# Patient Record
Sex: Female | Born: 1950 | Race: Black or African American | Hispanic: No | Marital: Married | Smoking: Never smoker
Health system: Southern US, Community
[De-identification: ages and names within clinical notes are randomized; demographics above are authoritative.]

## PROBLEM LIST (undated history)

## (undated) DIAGNOSIS — I10 Essential (primary) hypertension: Secondary | ICD-10-CM

## (undated) DIAGNOSIS — M1710 Unilateral primary osteoarthritis, unspecified knee: Secondary | ICD-10-CM

## (undated) DIAGNOSIS — M1990 Unspecified osteoarthritis, unspecified site: Secondary | ICD-10-CM

## (undated) HISTORY — PX: TONSILLECTOMY: SUR1361

## (undated) HISTORY — PX: OTHER SURGICAL HISTORY: SHX169

## (undated) HISTORY — PX: ABDOMINAL HYSTERECTOMY: SHX81

## (undated) HISTORY — PX: TOTAL KNEE ARTHROPLASTY: SHX125

## (undated) HISTORY — PX: JOINT REPLACEMENT: SHX530

---

## 2017-07-27 DIAGNOSIS — M1712 Unilateral primary osteoarthritis, left knee: Secondary | ICD-10-CM | POA: Diagnosis present

## 2017-07-27 DIAGNOSIS — I10 Essential (primary) hypertension: Secondary | ICD-10-CM | POA: Diagnosis present

## 2017-07-27 NOTE — H&P (Signed)
PREOPERATIVE H&P Patient ID: Autumn Hamilton Schreifels MRN: [id number] DOB/AGE: 12/14/1950 66 y.o.  Chief Complaint: OA LEFT  KNEE  Planned Procedure Date: 08/25/2017 Medical and cardiac clearance by Dr. Modesto CharonWong   HPI: Autumn Hamilton Denio is a 66 y.o. female who presents for evaluation of OA LEFT  KNEE. The patient has a history of pain and functional disability in both knees due to arthritis and has failed non-surgical conservative treatments for greater than 12 weeks to include NSAID's and/or analgesics, corticosteriod injections, viscosupplementation injections and activity modification.  Onset of symptoms was gradual, starting >10 years ago with gradually worsening course since that time.  Patient currently rates pain at 8 out of 10 with activity.  She has had bilateral knee arthroscopies with partial meniscectomy.  Patient has night pain, worsening of pain with activity and weight bearing and pain that interferes with activities of daily living.  Patient has evidence of subchondral cysts, subchondral sclerosis, periarticular osteophytes and joint space narrowing by imaging studies.  There is no active infection.  Past medical history: Hypertension  Medications: Duexis 800/26.6 mg, 1 up to 3 times daily as needed Biotin 10,000 mcg with keratin 100 mg.  1 daily. Aspirin 81 mg daily. Citrucel 2 capsules daily. Eplerenone 50 mg 2 tablets daily Verapamil ER 120 mg.  1 daily. Move Free joint cartilage bone supplement.  1 daily. AllerClear 10 mg antihistamine 1 daily. Tumeric curcumin 500/50 mg daily.  Surgical history: Partial hysterectomy 1995 Right knee arthroscopy, meniscectomy 2004 Left knee arthroscopy, meniscectomy 2006 Left shoulder biceps tear 2009 Tonsillectomy/adenoidectomy 1969  Allergies: NKDA  Social history:  Married never smoker no EtOH.  Retired from [employer].  Family history: Mother with HTN, cancer.  Sister with HTN, DM.  ROS: Currently denies lightheadedness, dizziness, Fever,  chills, CP, SOB.   No personal history of DVT, PE, MI, or CVA. No loose teeth or dentures All other systems have been reviewed and were otherwise currently negative with the exception of those mentioned in the HPI and as above.  Objective: Vitals: Ht: 5 feet 1 wt: 191 Temp: 97.4 BP: 138/77 pulse: 78 O2 99 % on room air. Physical Exam: General: Alert, NAD.  Antalgic Gait  HEENT: EOMI, Good Neck Extension  Pulm: No increased work of breathing.  Clear B/L A/P w/o crackle or wheeze.  CV: RRR, No m/g/r appreciated  GI: soft, NT, ND Neuro: Neuro without gross focal deficit.  Sensation intact distally Skin: No lesions in the area of chief complaint MSK/Surgical Site: Bilateral knees w/o redness or effusion.  Medial and lateral JLT. ROM 0-90.  5/5 strength in extension and flexion.  +EHL/FHL.  NVI.  Stable varus and valgus stress.    Imaging Review Plain radiographs demonstrate severe tricompartmental degenerative joint disease of the bilateral knees.   Assessment: OA LEFT  KNEE Principal Problem:   Primary osteoarthritis of left knee Active Problems:   Essential hypertension   Plan: Plan for Procedure(s): TOTAL KNEE ARTHROPLASTY  The patient history, physical exam, clinical judgement of the provider and imaging are consistent with end stage degenerative joint disease and total joint arthroplasty is deemed medically necessary. The treatment options including medical management, injection therapy, and arthroplasty were discussed at length. The risks and benefits of Procedure(s): TOTAL KNEE ARTHROPLASTY were presented and reviewed.  The risks of nonoperative treatment, versus surgical intervention including but not limited to continued pain, aseptic loosening, stiffness, dislocation/subluxation, infection, bleeding, nerve injury, blood clots, cardiopulmonary complications, morbidity, mortality, among others were discussed. The patient verbalizes understanding and wishes  to proceed with the  plan.  Patient is being admitted for inpatient treatment for surgery, pain control, PT, OT, prophylactic antibiotics, VTE prophylaxis, progressive ambulation, ADL's and discharge planning.   Dental prophylaxis discussed and recommended for 2 years postoperatively.   The patient does meet the criteria for TXA which will be used perioperatively via IV.    ASA 325 mg  will be used postoperatively for DVT prophylaxis in addition to SCDs, and early ambulation.  The patient is planning to be discharged home with home health services ([facility]) in care of her husband.  Oxycodone/Tylenol for pain.  She will continue Duexis postoperatively after discharge.  Severity of Illness: The appropriate patient status for this patient is OBSERVATION. Observation status is judged to be reasonable and necessary in order to provide the required intensity of service to ensure the patient's safety. The patient's presenting symptoms, physical exam findings, and initial radiographic and laboratory data in the context of their medical condition is felt to place them at decreased risk for further clinical deterioration. Furthermore, it is anticipated that the patient will be medically stable for discharge from the hospital within 2 midnights of admission. The following factors support the patient status of observation.    Albina BilletHenry Calvin Martensen III, PA-C 07/27/2017 5:26 PM

## 2017-08-13 ENCOUNTER — Encounter (HOSPITAL_COMMUNITY): Payer: Self-pay | Admitting: *Deleted

## 2017-08-13 NOTE — Pre-Procedure Instructions (Signed)
Autumn SellsMonica V Hamilton  08/13/2017      [id number] - [place], [place] - [address] [place] [id number] Phone: [phone] Fax: [phone]    Your procedure is scheduled on August 25, 2017  [facility] Admitting at 0530 A.M.  Call this number if you have problems the morning of surgery:  [phone]   Remember:  Do not eat food or drink liquids after midnight.  Take these medicines the morning of surgery with A SIP OF WATER Acetaminophen (Tylenol) if needed, Loratadine, and Verapamil (Calan-SR)  7 days prior to surgery STOP taking any Aspirin(unless otherwise instructed by your surgeon), Aleve, Naproxen, Ibuprofen, Motrin, Advil, Goody's, BC's, all herbal medications, fish oil, and all vitamins   Do not wear jewelry, make-up or nail polish.  Do not wear lotions, powders, or perfumes, or deodorant.  Do not shave 48 hours prior to surgery.  Do not bring valuables to the hospital.  [facility] is not responsible for any belongings or valuables.  Contacts, dentures or bridgework may not be worn into surgery.  Leave your suitcase in the car.  After surgery it may be brought to your room.  For patients admitted to the hospital, discharge time will be determined by your treatment team.  Patients discharged the day of surgery will not be allowed to drive home.    [place]- Preparing For Surgery  Before surgery, you can play an important role. Because skin is not sterile, your skin needs to be as free of germs as possible. You can reduce the number of germs on your skin by washing with CHG (chlorahexidine gluconate) Soap before surgery.  CHG is an antiseptic cleaner which kills germs and bonds with the skin to continue killing germs even after washing.  Please do not use if you have an allergy to CHG or antibacterial soaps. If your skin becomes reddened/irritated stop using the CHG.  Do not  shave (including legs and underarms) for at least 48 hours prior to first CHG shower. It is OK to shave your face.  Please follow these instructions carefully.   1. Shower the NIGHT BEFORE SURGERY and the MORNING OF SURGERY with CHG.   2. If you chose to wash your hair, wash your hair first as usual with your normal shampoo.  3. After you shampoo, rinse your hair and body thoroughly to remove the shampoo.  4. Use CHG as you would any other liquid soap. You can apply CHG directly to the skin and wash gently with a scrungie or a clean washcloth.   5. Apply the CHG Soap to your body ONLY FROM THE NECK DOWN.  Do not use on open wounds or open sores. Avoid contact with your eyes, ears, mouth and genitals (private parts). Wash Face and genitals (private parts)  with your normal soap.  6. Wash thoroughly, paying special attention to the area where your surgery will be performed.  7. Thoroughly rinse your body with warm water from the neck down.  8. DO NOT shower/wash with your normal soap after using and rinsing off the CHG Soap.  9. Pat yourself dry with a CLEAN TOWEL.  10. Wear CLEAN PAJAMAS to bed the night before surgery, wear comfortable clothes the morning of surgery  11. Place CLEAN SHEETS on your bed the night of your first shower and DO NOT SLEEP WITH PETS.    Day of Surgery: Do not apply  any deodorants/lotions. Please wear clean clothes to the hospital/surgery center.    Please read over the following fact sheets that you were given. Pain Booklet, Coughing and Deep Breathing, Total Joint Packet, MRSA Information and Surgical Site Infection Prevention

## 2017-08-14 ENCOUNTER — Encounter (HOSPITAL_COMMUNITY)
Admission: RE | Admit: 2017-08-14 | Discharge: 2017-08-14 | Disposition: A | Discharge status: Home / Self Care | Payer: 59 | Source: Ambulatory Visit | Attending: Orthopedic Surgery | Admitting: Orthopedic Surgery

## 2017-08-14 ENCOUNTER — Encounter (HOSPITAL_COMMUNITY): Payer: Self-pay

## 2017-08-14 ENCOUNTER — Other Ambulatory Visit: Payer: Self-pay

## 2017-08-14 DIAGNOSIS — M1712 Unilateral primary osteoarthritis, left knee: Secondary | ICD-10-CM | POA: Insufficient documentation

## 2017-08-14 DIAGNOSIS — Z01818 Encounter for other preprocedural examination: Secondary | ICD-10-CM | POA: Diagnosis not present

## 2017-08-14 HISTORY — DX: Unspecified osteoarthritis, unspecified site: M19.90

## 2017-08-14 LAB — BASIC METABOLIC PANEL
Anion gap: 10 (ref 5–15)
BUN: 7 mg/dL (ref 6–20)
CALCIUM: 9.4 mg/dL (ref 8.9–10.3)
CO2: 27 mmol/L (ref 22–32)
Chloride: 102 mmol/L (ref 101–111)
Creatinine, Ser: 0.77 mg/dL (ref 0.44–1.00)
GFR calc Af Amer: >60 mL/min (ref >60)
GLUCOSE: 140 mg/dL — AB (ref 65–99)
Potassium: 3.3 mmol/L — ABNORMAL LOW (ref 3.5–5.1)
Sodium: 139 mmol/L (ref 135–145)

## 2017-08-14 LAB — CBC
HCT: 37.9 % (ref 36.0–46.0)
Hemoglobin: 12.3 g/dL (ref 12.0–15.0)
MCH: 30.8 pg (ref 26.0–34.0)
MCHC: 32.5 g/dL (ref 30.0–36.0)
MCV: 94.8 fL (ref 78.0–100.0)
Platelets: 272 10*3/uL (ref 150–400)
RBC: 4 MIL/uL (ref 3.87–5.11)
RDW: 14.5 % (ref 11.5–15.5)
WBC: 5.4 10*3/uL (ref 4.0–10.5)

## 2017-08-14 LAB — SURGICAL PCR SCREEN
MRSA, PCR: NEGATIVE
STAPHYLOCOCCUS AUREUS: POSITIVE — AB

## 2017-08-14 NOTE — Progress Notes (Signed)
PCP: Dr. Leodis SiasFrancis Wong  Cardiologist: pt denies  EKG: 07/17/17-requested from Dr.   Michel SanteeStress test: pt denies ever  ECHO: pt denies ever  Cardiac Cath: pt denies ever  Chest x-ray: pt denies past year, no recent respiratory complications/infections

## 2017-08-18 ENCOUNTER — Encounter (HOSPITAL_COMMUNITY): Payer: Self-pay | Admitting: Emergency Medicine

## 2017-08-24 MED ORDER — LACTATED RINGERS IV SOLN: INTRAVENOUS | Status: DC

## 2017-08-24 MED ORDER — GABAPENTIN 300 MG PO CAPS
300.0000 mg | ORAL_CAPSULE | Freq: Once | ORAL | Status: AC
  Administered 2017-08-25: 300 mg via ORAL
  Filled 2017-08-24: qty 1

## 2017-08-24 MED ORDER — SODIUM CHLORIDE 0.9 % IV SOLN
1000.0000 mg | INTRAVENOUS | Status: AC
  Administered 2017-08-25: 1000 mg via INTRAVENOUS
  Filled 2017-08-24: qty 1100

## 2017-08-24 MED ORDER — ACETAMINOPHEN 500 MG PO TABS
1000.0000 mg | ORAL_TABLET | Freq: Once | ORAL | Status: AC
  Administered 2017-08-25: 1000 mg via ORAL
  Filled 2017-08-24: qty 2

## 2017-08-24 MED ORDER — CEFAZOLIN SODIUM-DEXTROSE 2-4 GM/100ML-% IV SOLN
2.0000 g | INTRAVENOUS | Status: AC
  Administered 2017-08-25: 2 g via INTRAVENOUS
  Filled 2017-08-24: qty 100

## 2017-08-24 NOTE — Anesthesia Preprocedure Evaluation (Addendum)
Anesthesia Evaluation  Patient identified by MRN, date of birth, ID band Patient awake    Reviewed: Allergy & Precautions, NPO status , Patient's Chart, lab work & pertinent test results  Airway Mallampati: II   Neck ROM: Full    Dental   Pulmonary neg pulmonary ROS,    breath sounds clear to auscultation       Cardiovascular hypertension,  Rhythm:Regular Rate:Normal     Neuro/Psych negative neurological ROS     GI/Hepatic negative GI ROS, Neg liver ROS,   Endo/Other  negative endocrine ROS  Renal/GU negative Renal ROS     Musculoskeletal  (+) Arthritis ,   Abdominal   Peds  Hematology   Anesthesia Other Findings   Reproductive/Obstetrics                             Anesthesia Physical Anesthesia Plan  ASA: II  Anesthesia Plan: Spinal   Post-op Pain Management:  Regional for Post-op pain   Induction: Intravenous  PONV Risk Score and Plan: 3 and Ondansetron, Dexamethasone and Treatment may vary due to age or medical condition  Airway Management Planned: Natural Airway  Additional Equipment:   Intra-op Plan:   Post-operative Plan:   Informed Consent: I have reviewed the patients History and Physical, chart, labs and discussed the procedure including the risks, benefits and alternatives for the proposed anesthesia with the patient or authorized representative who has indicated his/her understanding and acceptance.     Plan Discussed with: CRNA  Anesthesia Plan Comments:        Anesthesia Quick Evaluation

## 2017-08-25 ENCOUNTER — Inpatient Hospital Stay (HOSPITAL_COMMUNITY): Payer: 59

## 2017-08-25 ENCOUNTER — Encounter (HOSPITAL_COMMUNITY)
Admission: RE | Disposition: A | Discharge status: Home Health | Payer: Self-pay | Source: Ambulatory Visit | Attending: Orthopedic Surgery

## 2017-08-25 ENCOUNTER — Inpatient Hospital Stay (HOSPITAL_COMMUNITY): Payer: 59 | Admitting: Anesthesiology

## 2017-08-25 ENCOUNTER — Observation Stay (HOSPITAL_COMMUNITY)
Admission: RE | Admit: 2017-08-25 | Discharge: 2017-08-26 | Disposition: A | Discharge status: Home Health | Payer: 59 | Source: Ambulatory Visit | Attending: Orthopedic Surgery | Admitting: Orthopedic Surgery

## 2017-08-25 ENCOUNTER — Inpatient Hospital Stay (HOSPITAL_COMMUNITY): Payer: 59 | Admitting: Emergency Medicine

## 2017-08-25 ENCOUNTER — Encounter (HOSPITAL_COMMUNITY): Payer: Self-pay

## 2017-08-25 DIAGNOSIS — Z79899 Other long term (current) drug therapy: Secondary | ICD-10-CM | POA: Insufficient documentation

## 2017-08-25 DIAGNOSIS — Z90711 Acquired absence of uterus with remaining cervical stump: Secondary | ICD-10-CM | POA: Insufficient documentation

## 2017-08-25 DIAGNOSIS — Z833 Family history of diabetes mellitus: Secondary | ICD-10-CM | POA: Insufficient documentation

## 2017-08-25 DIAGNOSIS — Z8249 Family history of ischemic heart disease and other diseases of the circulatory system: Secondary | ICD-10-CM | POA: Diagnosis not present

## 2017-08-25 DIAGNOSIS — M170 Bilateral primary osteoarthritis of knee: Principal | ICD-10-CM | POA: Insufficient documentation

## 2017-08-25 DIAGNOSIS — Z96659 Presence of unspecified artificial knee joint: Secondary | ICD-10-CM

## 2017-08-25 DIAGNOSIS — I10 Essential (primary) hypertension: Secondary | ICD-10-CM | POA: Insufficient documentation

## 2017-08-25 DIAGNOSIS — M1712 Unilateral primary osteoarthritis, left knee: Secondary | ICD-10-CM | POA: Diagnosis present

## 2017-08-25 DIAGNOSIS — Z9889 Other specified postprocedural states: Secondary | ICD-10-CM | POA: Insufficient documentation

## 2017-08-25 HISTORY — PX: TOTAL KNEE ARTHROPLASTY: SHX125

## 2017-08-25 HISTORY — DX: Essential (primary) hypertension: I10

## 2017-08-25 SURGERY — ARTHROPLASTY, KNEE, TOTAL
Anesthesia: Spinal | Site: Knee | Laterality: Left

## 2017-08-25 MED ORDER — ACETAMINOPHEN 325 MG PO TABS: 650.0000 mg | ORAL_TABLET | ORAL | Status: DC | PRN

## 2017-08-25 MED ORDER — ASPIRIN EC 325 MG PO TBEC
325.0000 mg | DELAYED_RELEASE_TABLET | Freq: Every day | ORAL | Status: DC
  Administered 2017-08-26: 325 mg via ORAL
  Filled 2017-08-25: qty 1

## 2017-08-25 MED ORDER — MIDAZOLAM HCL 5 MG/5ML IJ SOLN
INTRAMUSCULAR | Status: DC | PRN
  Administered 2017-08-25 (×2): 1 mg via INTRAVENOUS

## 2017-08-25 MED ORDER — OXYCODONE HCL 5 MG PO TABS: 5.0000 mg | ORAL_TABLET | ORAL | 0 refills | Status: AC | PRN

## 2017-08-25 MED ORDER — SPIRONOLACTONE 25 MG PO TABS
25.0000 mg | ORAL_TABLET | Freq: Every day | ORAL | Status: DC
  Administered 2017-08-26: 25 mg via ORAL
  Filled 2017-08-25: qty 1

## 2017-08-25 MED ORDER — SENNA 8.6 MG PO TABS
1.0000 | ORAL_TABLET | Freq: Two times a day (BID) | ORAL | Status: DC
  Administered 2017-08-25 – 2017-08-26 (×2): 8.6 mg via ORAL
  Filled 2017-08-25 (×2): qty 1

## 2017-08-25 MED ORDER — ONDANSETRON HCL 4 MG PO TABS: 4.0000 mg | ORAL_TABLET | Freq: Four times a day (QID) | ORAL | Status: DC | PRN

## 2017-08-25 MED ORDER — SODIUM CHLORIDE FLUSH 0.9 % IV SOLN
INTRAVENOUS | Status: DC | PRN
  Administered 2017-08-25 (×3): 10 mL

## 2017-08-25 MED ORDER — 0.9 % SODIUM CHLORIDE (POUR BTL) OPTIME
TOPICAL | Status: DC | PRN
  Administered 2017-08-25: 1000 mL

## 2017-08-25 MED ORDER — LACTATED RINGERS IV SOLN: INTRAVENOUS | Status: DC

## 2017-08-25 MED ORDER — BUPIVACAINE HCL (PF) 0.25 % IJ SOLN
INTRAMUSCULAR | Status: DC | PRN
  Administered 2017-08-25: 30 mL

## 2017-08-25 MED ORDER — EPHEDRINE SULFATE 50 MG/ML IJ SOLN
INTRAMUSCULAR | Status: DC | PRN
  Administered 2017-08-25: 10 mg via INTRAVENOUS

## 2017-08-25 MED ORDER — METHOCARBAMOL 1000 MG/10ML IJ SOLN
500.0000 mg | Freq: Four times a day (QID) | INTRAVENOUS | Status: DC | PRN
  Filled 2017-08-25: qty 5

## 2017-08-25 MED ORDER — ONDANSETRON HCL 4 MG/2ML IJ SOLN
INTRAMUSCULAR | Status: DC | PRN
  Administered 2017-08-25: 4 mg via INTRAVENOUS

## 2017-08-25 MED ORDER — DEXAMETHASONE SODIUM PHOSPHATE 10 MG/ML IJ SOLN
10.0000 mg | Freq: Once | INTRAMUSCULAR | Status: DC
  Filled 2017-08-25: qty 1

## 2017-08-25 MED ORDER — VERAPAMIL HCL ER 120 MG PO TBCR
120.0000 mg | EXTENDED_RELEASE_TABLET | Freq: Every day | ORAL | Status: DC
  Administered 2017-08-26: 120 mg via ORAL
  Filled 2017-08-25: qty 1

## 2017-08-25 MED ORDER — ONDANSETRON HCL 4 MG/2ML IJ SOLN: 4.0000 mg | Freq: Four times a day (QID) | INTRAMUSCULAR | Status: DC | PRN

## 2017-08-25 MED ORDER — DIPHENHYDRAMINE HCL 12.5 MG/5ML PO ELIX: 12.5000 mg | ORAL_SOLUTION | ORAL | Status: DC | PRN

## 2017-08-25 MED ORDER — FENTANYL CITRATE (PF) 100 MCG/2ML IJ SOLN
INTRAMUSCULAR | Status: DC | PRN
  Administered 2017-08-25: 25 ug via INTRAVENOUS
  Administered 2017-08-25: 75 ug via INTRAVENOUS
  Administered 2017-08-25: 50 ug via INTRAVENOUS

## 2017-08-25 MED ORDER — METOCLOPRAMIDE HCL 5 MG/ML IJ SOLN: 5.0000 mg | Freq: Three times a day (TID) | INTRAMUSCULAR | Status: DC | PRN

## 2017-08-25 MED ORDER — PHENYLEPHRINE HCL 10 MG/ML IJ SOLN
INTRAMUSCULAR | Status: DC | PRN
  Administered 2017-08-25 (×2): 120 ug via INTRAVENOUS
  Administered 2017-08-25: 80 ug via INTRAVENOUS

## 2017-08-25 MED ORDER — OXYCODONE HCL 5 MG PO TABS
10.0000 mg | ORAL_TABLET | ORAL | Status: DC | PRN
  Administered 2017-08-26 (×2): 10 mg via ORAL
  Filled 2017-08-25 (×2): qty 2

## 2017-08-25 MED ORDER — CEFAZOLIN SODIUM-DEXTROSE 1-4 GM/50ML-% IV SOLN
1.0000 g | Freq: Four times a day (QID) | INTRAVENOUS | Status: AC
  Administered 2017-08-25: 1 g via INTRAVENOUS
  Filled 2017-08-25 (×3): qty 50

## 2017-08-25 MED ORDER — METHOCARBAMOL 500 MG PO TABS: 500.0000 mg | ORAL_TABLET | Freq: Four times a day (QID) | ORAL | 0 refills | Status: DC | PRN

## 2017-08-25 MED ORDER — TRANEXAMIC ACID 1000 MG/10ML IV SOLN
2000.0000 mg | INTRAVENOUS | Status: DC
  Filled 2017-08-25: qty 20

## 2017-08-25 MED ORDER — SODIUM CHLORIDE 0.9 % IJ SOLN
INTRAMUSCULAR | Status: AC
  Filled 2017-08-25: qty 30

## 2017-08-25 MED ORDER — POLYETHYLENE GLYCOL 3350 17 G PO PACK: 17.0000 g | PACK | Freq: Every day | ORAL | Status: DC | PRN

## 2017-08-25 MED ORDER — CHLORHEXIDINE GLUCONATE 4 % EX LIQD: 60.0000 mL | Freq: Once | CUTANEOUS | Status: DC

## 2017-08-25 MED ORDER — MENTHOL 3 MG MT LOZG: 1.0000 | LOZENGE | OROMUCOSAL | Status: DC | PRN

## 2017-08-25 MED ORDER — PROPOFOL 10 MG/ML IV BOLUS
INTRAVENOUS | Status: DC | PRN
  Administered 2017-08-25 (×3): 15 mg via INTRAVENOUS

## 2017-08-25 MED ORDER — CELECOXIB 200 MG PO CAPS
200.0000 mg | ORAL_CAPSULE | Freq: Two times a day (BID) | ORAL | Status: DC
  Administered 2017-08-25 – 2017-08-26 (×2): 200 mg via ORAL
  Filled 2017-08-25 (×2): qty 1

## 2017-08-25 MED ORDER — BUPIVACAINE HCL (PF) 0.25 % IJ SOLN
INTRAMUSCULAR | Status: AC
  Filled 2017-08-25: qty 30

## 2017-08-25 MED ORDER — ACETAMINOPHEN 650 MG RE SUPP: 650.0000 mg | RECTAL | Status: DC | PRN

## 2017-08-25 MED ORDER — METOCLOPRAMIDE HCL 5 MG PO TABS: 5.0000 mg | ORAL_TABLET | Freq: Three times a day (TID) | ORAL | Status: DC | PRN

## 2017-08-25 MED ORDER — PHENYLEPHRINE HCL 10 MG/ML IJ SOLN
INTRAVENOUS | Status: DC | PRN
  Administered 2017-08-25: 30 ug/min via INTRAVENOUS

## 2017-08-25 MED ORDER — PROPOFOL 500 MG/50ML IV EMUL
INTRAVENOUS | Status: DC | PRN
  Administered 2017-08-25: 80 ug/kg/min via INTRAVENOUS

## 2017-08-25 MED ORDER — OXYCODONE HCL 5 MG PO TABS
5.0000 mg | ORAL_TABLET | ORAL | Status: DC | PRN
  Administered 2017-08-26 (×2): 5 mg via ORAL
  Filled 2017-08-25: qty 1

## 2017-08-25 MED ORDER — HYDROMORPHONE HCL 1 MG/ML IJ SOLN
1.0000 mg | INTRAMUSCULAR | Status: DC | PRN
  Administered 2017-08-25: 1 mg via INTRAVENOUS
  Filled 2017-08-25: qty 1

## 2017-08-25 MED ORDER — ACETAMINOPHEN 500 MG PO TABS
1000.0000 mg | ORAL_TABLET | Freq: Three times a day (TID) | ORAL | Status: DC
  Administered 2017-08-25 – 2017-08-26 (×3): 1000 mg via ORAL
  Filled 2017-08-25 (×3): qty 2

## 2017-08-25 MED ORDER — ACETAMINOPHEN 500 MG PO TABS: 1000.0000 mg | ORAL_TABLET | Freq: Three times a day (TID) | ORAL | 0 refills | Status: AC

## 2017-08-25 MED ORDER — OMEPRAZOLE 20 MG PO CPDR: 20.0000 mg | DELAYED_RELEASE_CAPSULE | Freq: Every day | ORAL | 0 refills | Status: DC

## 2017-08-25 MED ORDER — FENTANYL CITRATE (PF) 250 MCG/5ML IJ SOLN
INTRAMUSCULAR | Status: AC
  Filled 2017-08-25: qty 5

## 2017-08-25 MED ORDER — MIDAZOLAM HCL 2 MG/2ML IJ SOLN
INTRAMUSCULAR | Status: AC
  Filled 2017-08-25: qty 2

## 2017-08-25 MED ORDER — PHENOL 1.4 % MT LIQD: 1.0000 | OROMUCOSAL | Status: DC | PRN

## 2017-08-25 MED ORDER — DOCUSATE SODIUM 100 MG PO CAPS: 100.0000 mg | ORAL_CAPSULE | Freq: Two times a day (BID) | ORAL | 0 refills | Status: DC

## 2017-08-25 MED ORDER — DOCUSATE SODIUM 100 MG PO CAPS
100.0000 mg | ORAL_CAPSULE | Freq: Two times a day (BID) | ORAL | Status: DC
  Administered 2017-08-25 – 2017-08-26 (×2): 100 mg via ORAL
  Filled 2017-08-25 (×2): qty 1

## 2017-08-25 MED ORDER — KETOROLAC TROMETHAMINE 30 MG/ML IJ SOLN
INTRAMUSCULAR | Status: DC | PRN
  Administered 2017-08-25: 30 mg via INTRA_ARTICULAR

## 2017-08-25 MED ORDER — SORBITOL 70 % SOLN: 30.0000 mL | Freq: Every day | Status: DC | PRN

## 2017-08-25 MED ORDER — METHOCARBAMOL 500 MG PO TABS: 500.0000 mg | ORAL_TABLET | Freq: Four times a day (QID) | ORAL | Status: DC | PRN

## 2017-08-25 MED ORDER — LACTATED RINGERS IV SOLN
INTRAVENOUS | Status: DC | PRN
  Administered 2017-08-25 (×2): via INTRAVENOUS

## 2017-08-25 MED ORDER — ONDANSETRON HCL 4 MG PO TABS: 4.0000 mg | ORAL_TABLET | Freq: Three times a day (TID) | ORAL | 0 refills | Status: DC | PRN

## 2017-08-25 MED ORDER — DEXAMETHASONE SODIUM PHOSPHATE 10 MG/ML IJ SOLN
INTRAMUSCULAR | Status: DC | PRN
  Administered 2017-08-25: 5 mg via INTRAVENOUS

## 2017-08-25 MED ORDER — PROPOFOL 10 MG/ML IV BOLUS
INTRAVENOUS | Status: AC
  Filled 2017-08-25: qty 20

## 2017-08-25 MED ORDER — ROPIVACAINE HCL 7.5 MG/ML IJ SOLN
INTRAMUSCULAR | Status: DC | PRN
  Administered 2017-08-25: 20 mL via PERINEURAL

## 2017-08-25 MED ORDER — BUPIVACAINE IN DEXTROSE 0.75-8.25 % IT SOLN
INTRATHECAL | Status: DC | PRN
  Administered 2017-08-25: 1.6 mL via INTRATHECAL

## 2017-08-25 MED ORDER — SODIUM CHLORIDE 0.9 % IR SOLN
Status: DC | PRN
  Administered 2017-08-25: 3000 mL

## 2017-08-25 MED ORDER — ASPIRIN EC 325 MG PO TBEC: 325.0000 mg | DELAYED_RELEASE_TABLET | Freq: Every day | ORAL | 0 refills | Status: DC

## 2017-08-25 SURGICAL SUPPLY — 54 items
BANDAGE ELASTIC 6 VELCRO ST LF (GAUZE/BANDAGES/DRESSINGS) ×2 IMPLANT
BANDAGE ESMARK 6X9 LF (GAUZE/BANDAGES/DRESSINGS) ×1 IMPLANT
BLADE SAG 18X100X1.27 (BLADE) ×4 IMPLANT
BNDG ELASTIC 6X10 VLCR STRL LF (GAUZE/BANDAGES/DRESSINGS) ×2 IMPLANT
BNDG ELASTIC 6X15 VLCR STRL LF (GAUZE/BANDAGES/DRESSINGS) ×2 IMPLANT
BNDG ESMARK 6X9 LF (GAUZE/BANDAGES/DRESSINGS) ×2
BOWL SMART MIX CTS (DISPOSABLE) ×2 IMPLANT
CAPT KNEE TRIATH TK-4 ×2 IMPLANT
CLSR STERI-STRIP ANTIMIC 1/2X4 (GAUZE/BANDAGES/DRESSINGS) ×4 IMPLANT
COVER SURGICAL LIGHT HANDLE (MISCELLANEOUS) ×2 IMPLANT
CUFF TOURNIQUET SINGLE 34IN LL (TOURNIQUET CUFF) ×2 IMPLANT
DRAPE EXTREMITY T 121X128X90 (DRAPE) ×2 IMPLANT
DRAPE HALF SHEET 40X57 (DRAPES) ×2 IMPLANT
DRAPE U-SHAPE 47X51 STRL (DRAPES) ×2 IMPLANT
DRSG MEPILEX BORDER 4X12 (GAUZE/BANDAGES/DRESSINGS) ×2 IMPLANT
DRSG MEPILEX BORDER 4X8 (GAUZE/BANDAGES/DRESSINGS) ×2 IMPLANT
DURAPREP 26ML APPLICATOR (WOUND CARE) ×4 IMPLANT
ELECT CAUTERY BLADE 6.4 (BLADE) ×2 IMPLANT
ELECT REM PT RETURN 9FT ADLT (ELECTROSURGICAL) ×2
ELECTRODE REM PT RTRN 9FT ADLT (ELECTROSURGICAL) ×1 IMPLANT
FACESHIELD WRAPAROUND (MASK) ×4 IMPLANT
GLOVE BIO SURGEON STRL SZ7.5 (GLOVE) ×4 IMPLANT
GLOVE BIOGEL PI IND STRL 8 (GLOVE) ×2 IMPLANT
GLOVE BIOGEL PI INDICATOR 8 (GLOVE) ×2
GOWN STRL REUS W/ TWL LRG LVL3 (GOWN DISPOSABLE) ×2 IMPLANT
GOWN STRL REUS W/ TWL XL LVL3 (GOWN DISPOSABLE) ×2 IMPLANT
GOWN STRL REUS W/TWL LRG LVL3 (GOWN DISPOSABLE) ×2
GOWN STRL REUS W/TWL XL LVL3 (GOWN DISPOSABLE) ×2
HANDPIECE INTERPULSE COAX TIP (DISPOSABLE) ×1
IMMOBILIZER KNEE 22 UNIV (SOFTGOODS) ×2 IMPLANT
KIT BASIN OR (CUSTOM PROCEDURE TRAY) ×2 IMPLANT
KIT ROOM TURNOVER OR (KITS) ×2 IMPLANT
MANIFOLD NEPTUNE II (INSTRUMENTS) ×2 IMPLANT
NEEDLE 22X1 1/2 (OR ONLY) (NEEDLE) ×2 IMPLANT
NS IRRIG 1000ML POUR BTL (IV SOLUTION) ×2 IMPLANT
PACK TOTAL JOINT (CUSTOM PROCEDURE TRAY) ×2 IMPLANT
PAD ARMBOARD 7.5X6 YLW CONV (MISCELLANEOUS) ×2 IMPLANT
SET HNDPC FAN SPRY TIP SCT (DISPOSABLE) ×1 IMPLANT
SUCTION FRAZIER HANDLE 10FR (MISCELLANEOUS) ×1
SUCTION TUBE FRAZIER 10FR DISP (MISCELLANEOUS) ×1 IMPLANT
SUT MNCRL AB 4-0 PS2 18 (SUTURE) ×2 IMPLANT
SUT MON AB 2-0 CT1 27 (SUTURE) ×2 IMPLANT
SUT MON AB 2-0 CT1 36 (SUTURE) ×4 IMPLANT
SUT VIC AB 0 CT1 27 (SUTURE) ×2
SUT VIC AB 0 CT1 27XBRD ANBCTR (SUTURE) ×2 IMPLANT
SUT VIC AB 1 CT1 27 (SUTURE) ×3
SUT VIC AB 1 CT1 27XBRD ANBCTR (SUTURE) ×3 IMPLANT
SUT VIC AB 2-0 CT1 27 (SUTURE) ×1
SUT VIC AB 2-0 CT1 TAPERPNT 27 (SUTURE) ×1 IMPLANT
SYR 50ML LL SCALE MARK (SYRINGE) ×2 IMPLANT
TOWEL OR 17X24 6PK STRL BLUE (TOWEL DISPOSABLE) ×2 IMPLANT
TOWEL OR 17X26 10 PK STRL BLUE (TOWEL DISPOSABLE) ×2 IMPLANT
TRAY CATH 16FR W/PLASTIC CATH (SET/KITS/TRAYS/PACK) IMPLANT
TRAY FOLEY BAG SILVER LF 14FR (CATHETERS) IMPLANT

## 2017-08-25 NOTE — Progress Notes (Signed)
Orthopedic Tech Progress Note Patient Details:  Autumn Hamilton 03/12/1951 [id number]  CPM Left Knee CPM Left Knee: On Left Knee Flexion (Degrees): 90 Left Knee Extension (Degrees): 0 Additional Comments: trapeze bar patient helper  Post Interventions Patient Tolerated: Well Instructions Provided: Care of device  Nikki DomCrawford, Taquita Demby 08/25/2017, 10:59 AM Viewed order from doctor's order list

## 2017-08-25 NOTE — Op Note (Signed)
DATE OF SURGERY:  08/25/2017 TIME: 8:54 AM  PATIENT NAME:  Autumn Hamilton   AGE: 67 y.o.    PRE-OPERATIVE DIAGNOSIS:  OA LEFT  KNEE  POST-OPERATIVE DIAGNOSIS:  Same  PROCEDURE:  Procedure(s): TOTAL KNEE ARTHROPLASTY   SURGEON:  MURPHY, TIMOTHY D, MD   ASSISTANT:  Aquilla HackerHenry Martensen, PA-C, he was present and scrubbed throughout the case, critical for completion in a timely fashion, and for retraction, instrumentation, and closure.    OPERATIVE IMPLANTS: Stryker Triathlon Posterior Stabilized. Press fit knee  Femur size 3, Tibia size 4, Patella size 29 3-peg oval button, with a 9 mm polyethylene insert.   PREOPERATIVE INDICATIONS:  Autumn SellsMonica V Hamilton is a 67 y.o. year old female with end stage bone on bone degenerative arthritis of the knee who failed conservative treatment, including injections, antiinflammatories, activity modification, and assistive devices, and had significant impairment of their activities of daily living, and elected for Total Knee Arthroplasty.   The risks, benefits, and alternatives were discussed at length including but not limited to the risks of infection, bleeding, nerve injury, stiffness, blood clots, the need for revision surgery, cardiopulmonary complications, among others, and they were willing to proceed.   OPERATIVE DESCRIPTION:  The patient was brought to the operative room and placed in a supine position.  General anesthesia was administered.  IV antibiotics were given.  The lower extremity was prepped and draped in the usual sterile fashion.  Time out was performed.  The leg was elevated and exsanguinated and the tourniquet was inflated.  Anterior approach was performed.  The patella was everted and osteophytes were removed.  The anterior horn of the medial and lateral meniscus was removed.   The distal femur was opened with the drill and the intramedullary distal femoral cutting jig was utilized, set at 5 degrees resecting 8 mm off the distal femur.   Care was taken to protect the collateral ligaments.  The distal femoral sizing jig was applied, taking care to avoid notching.  Then the 4-in-1 cutting jig was applied and the anterior and posterior femur was cut, along with the chamfer cuts.  All posterior osteophytes were removed.  The flexion gap was then measured and was symmetric with the extension gap.  Then the extramedullary tibial cutting jig was utilized making the appropriate cut using the anterior tibial crest as a reference building in appropriate posterior slope.  Care was taken during the cut to protect the medial and collateral ligaments.  The proximal tibia was removed along with the posterior horns of the menisci.  The PCL was sacrificed.    The extensor gap was measured and was approximately 9mm.    I completed the distal femoral preparation using the appropriate jig to prepare the box.  The patella was then measured, and cut with the saw.    The proximal tibia sized and prepared accordingly with the reamer and the punch, and then all components were trialed with the above sized poly insert.  The knee was found to have excellent balance and full motion.    The above named components were then impacted into place and Poly tibial piece and patella were inserted.  I was very happy with his stability and ROM  I performed a periarticular injection with marcaine and toradol  The knee was easily taken through a range of motion and the patella tracked well and the knee irrigated copiously and the parapatellar and subcutaneous tissue closed with vicryl, and monocryl with steri strips for the skin.  The incision was dressed with sterile gauze and the tourniquet released and the patient was awakened and returned to the PACU in stable and satisfactory condition.  There were no complications.  Total tourniquet time was roughly 70 minutes.   POSTOPERATIVE PLAN: post op Abx, DVT px: SCD's, TED's, Early ambulation and chemical  px

## 2017-08-25 NOTE — Anesthesia Procedure Notes (Signed)
Procedure Name: MAC Date/Time: 08/25/2017 7:45 AM Performed by: White, Amedeo Plenty, CRNA Pre-anesthesia Checklist: Patient identified, Emergency Drugs available, Suction available and Patient being monitored Patient Re-evaluated:Patient Re-evaluated prior to induction Oxygen Delivery Method: Simple face mask

## 2017-08-25 NOTE — Anesthesia Procedure Notes (Addendum)
Anesthesia Regional Block: Adductor canal block   Pre-Anesthetic Checklist: ,, timeout performed, Correct Patient, Correct Site, Correct Laterality, Correct Procedure, Correct Position, site marked, Risks and benefits discussed,  Surgical consent,  Pre-op evaluation,  At surgeon's request and post-op pain management  Laterality: Left and Lower  Prep: chloraprep       Needles:   Needle Type: Echogenic Stimulator Needle     Needle Length: 9cm  Needle Gauge: 21   Needle insertion depth: 7 cm   Additional Needles:   Procedures:,,,, ultrasound used (permanent image in chart),,,,  Narrative:  Start time: 08/25/2017 6:55 AM End time: 08/25/2017 7:11 AM Injection made incrementally with aspirations every 5 mL.  Performed by: Personally  Anesthesiologist: Sharee HolsterMassagee, Stepehn Eckard, MD

## 2017-08-25 NOTE — Evaluation (Signed)
Physical Therapy Evaluation Patient Details Name: Autumn Hamilton MRN: [id number] DOB: 05/05/1951 Today's Date: 08/25/2017   History of Present Illness  67 y.o. year old female with end stage bone on bone degenerative arthritis of the L knee, s/p L TKA 08/25/17 . PMH of HTN  Clinical Impression  Pt is s/p TKA resulting in the deficits listed below (see PT Problem List). 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 Pt currently mod I for bed mobility, min guard for transfers and ambulation of 20 feet with RW. Pt will benefit from skilled PT to increase their independence and safety with mobility to allow discharge to the venue listed below.      Follow Up Recommendations Home health PT;Supervision/Assistance - 24 hour    Equipment Recommendations  None recommended by PT    Recommendations for Other Services       Precautions / Restrictions Precautions Precautions: Knee Precaution Booklet Issued: Yes (comment) Precaution Comments: TKA exercise booklet Restrictions Weight Bearing Restrictions: Yes LLE Weight Bearing: Weight bearing as tolerated      Mobility  Bed Mobility Overal bed mobility: Modified Independent             General bed mobility comments: HoB elevated, use of bedrails to pull to EoB  Transfers Overall transfer level: Needs assistance Equipment used: Rolling walker (2 wheeled) Transfers: Sit to/from Stand Sit to Stand: Min guard         General transfer comment: min guard for safety, from bed surface and from toilet, good power up and steadying with  RW  Ambulation/Gait Ambulation/Gait assistance: Min guard Ambulation Distance (Feet): 20 Feet Assistive device: Rolling walker (2 wheeled) Gait Pattern/deviations: Step-through pattern;Decreased stance time - left;Decreased weight shift to left;Decreased step length - right Gait velocity: slowed Gait velocity interpretation: Below normal speed for age/gender General Gait Details:  hands on min guard for safety, no LoB, no knee buckling        Balance Overall balance assessment: Needs assistance Sitting-balance support: No upper extremity supported;Feet supported Sitting balance-Leahy Scale: Normal     Standing balance support: No upper extremity supported;During functional activity Standing balance-Leahy Scale: Good                               Pertinent Vitals/Pain Pain Assessment: No/denies pain    Home Living Family/patient expects to be discharged to:: Private residence Living Arrangements: Spouse/significant other Available Help at Discharge: Family;Available 24 hours/day Type of Home: House Home Access: Stairs to enter Entrance Stairs-Rails: Right Entrance Stairs-Number of Steps: 6 Home Layout: Multi-level;Able to live on main level with bedroom/bathroom Home Equipment: Dan Humphreys - 2 wheels;Shower seat - built in      Prior Function Level of Independence: Independent         Comments: Engineer, structural, driver        Extremity/Trunk Assessment   Upper Extremity Assessment Upper Extremity Assessment: Overall WFL for tasks assessed    Lower Extremity Assessment Lower Extremity Assessment: RLE deficits/detail;LLE deficits/detail LLE Deficits / Details: L knee ROM limited LLE: Unable to fully assess due to pain       Communication   Communication: No difficulties  Cognition Arousal/Alertness: Awake/alert Behavior During Therapy: WFL for tasks assessed/performed Overall Cognitive Status: Within Functional Limits for tasks assessed  General Comments General comments (skin integrity, edema, etc.): VSS, pt husband and sisters present,         Assessment/Plan    PT Assessment Patient needs continued PT services  PT Problem List Decreased mobility;Decreased activity tolerance;Decreased range of motion;Decreased strength       PT Treatment Interventions DME  instruction;Gait training;Stair training;Functional mobility training;Therapeutic activities;Therapeutic exercise;Balance training;Patient/family education    PT Goals (Current goals can be found in the Care Plan section)  Acute Rehab PT Goals Patient Stated Goal: get back to dancing PT Goal Formulation: With patient Time For Goal Achievement: 09/08/17 Potential to Achieve Goals: Good    Frequency 7X/week    AM-PAC PT "6 Clicks" Daily Activity  Outcome Measure Difficulty turning over in bed (including adjusting bedclothes, sheets and blankets)?: A Little Difficulty moving from lying on back to sitting on the side of the bed? : A Little Difficulty sitting down on and standing up from a chair with arms (e.g., wheelchair, bedside commode, etc,.)?: A Little Help needed moving to and from a bed to chair (including a wheelchair)?: None Help needed walking in hospital room?: None Help needed climbing 3-5 steps with a railing? : A Lot 6 Click Score: 19    End of Session Equipment Utilized During Treatment: Gait belt Activity Tolerance: Patient tolerated treatment well Patient left: in chair;with call bell/phone within reach;with family/visitor present Nurse Communication: Mobility status PT Visit Diagnosis: Other abnormalities of gait and mobility (R26.89);Difficulty in walking, not elsewhere classified (R26.2)    Time: 1610-1650 PT Time Calculation (min) (ACUTE ONLY): 40 min   Charges:   PT Evaluation $PT Eval Low Complexity: 1 Low PT Treatments $Gait Training: 8-22 mins $Therapeutic Activity: 8-22 mins   PT G Codes:        [name] PT, DPT Acute Rehabilitation  [phone] Pager [phone]    [name] 08/25/2017, 5:13 PM

## 2017-08-25 NOTE — Interval H&P Note (Signed)
History and Physical Interval Note:  08/25/2017 7:32 AM  Autumn Hamilton  has presented today for surgery, with the diagnosis of OA LEFT  KNEE  The various methods of treatment have been discussed with the patient and family. After consideration of risks, benefits and other options for treatment, the patient has consented to  Procedure(s): TOTAL KNEE ARTHROPLASTY (Left) as a surgical intervention .  The patient's history has been reviewed, patient examined, no change in status, stable for surgery.  I have reviewed the patient's chart and labs.  Questions were answered to the patient's satisfaction.     MURPHY, TIMOTHY D

## 2017-08-25 NOTE — Anesthesia Procedure Notes (Signed)
Spinal  Patient location during procedure: OR Start time: 08/25/2017 7:35 AM End time: 08/25/2017 7:45 AM Staffing Anesthesiologist: Sharee HolsterMassagee, Octaviano Mukai, MD Performed: anesthesiologist  Preanesthetic Checklist Completed: patient identified, site marked, surgical consent, pre-op evaluation, timeout performed, IV checked, risks and benefits discussed and monitors and equipment checked Spinal Block Patient position: sitting Prep: ChloraPrep and site prepped and draped Patient monitoring: heart rate, cardiac monitor, continuous pulse ox and blood pressure Approach: midline Location: L3-4 Injection technique: single-shot Needle Needle type: Pencan  Needle gauge: 24 G Needle length: 9 cm Needle insertion depth: 5 cm Assessment Sensory level: T6

## 2017-08-25 NOTE — Plan of Care (Signed)
  Progressing Education: Knowledge of General Education information will improve 08/25/2017 1619 - Progressing by Darreld Mcleanox, Sacheen Arrasmith, RN Health Behavior/Discharge Planning: Ability to manage health-related needs will improve 08/25/2017 1619 - Progressing by Darreld Mcleanox, Strummer Canipe, RN Clinical Measurements: Ability to maintain clinical measurements within normal limits will improve 08/25/2017 1619 - Progressing by Darreld Mcleanox, Rumi Kolodziej, RN Will remain free from infection 08/25/2017 1619 - Progressing by Darreld Mcleanox, Hanad Leino, RN Diagnostic test results will improve 08/25/2017 1619 - Progressing by Darreld Mcleanox, Jibreel Fedewa, RN Respiratory complications will improve 08/25/2017 1619 - Progressing by Darreld Mcleanox, Jaquis Picklesimer, RN Cardiovascular complication will be avoided 08/25/2017 1619 - Progressing by Darreld Mcleanox, Davona Kinoshita, RN Activity: Risk for activity intolerance will decrease 08/25/2017 1619 - Progressing by Darreld Mcleanox, Taitum Alms, RN Nutrition: Adequate nutrition will be maintained 08/25/2017 1619 - Progressing by Darreld Mcleanox, Maddie Brazier, RN Coping: Level of anxiety will decrease 08/25/2017 1619 - Progressing by Darreld Mcleanox, Ivery Nanney, RN Elimination: Will not experience complications related to bowel motility 08/25/2017 1619 - Progressing by Darreld Mcleanox, Julian Medina, RN Will not experience complications related to urinary retention 08/25/2017 1619 - Progressing by Darreld Mcleanox, Beauty Pless, RN Pain Managment: General experience of comfort will improve 08/25/2017 1619 - Progressing by Darreld Mcleanox, Eddy Liszewski, RN Safety: Ability to remain free from injury will improve 08/25/2017 1619 - Progressing by Darreld Mcleanox, Sitlali Koerner, RN Skin Integrity: Risk for impaired skin integrity will decrease 08/25/2017 1619 - Progressing by Darreld Mcleanox, Kahealani Yankovich, RN

## 2017-08-25 NOTE — Transfer of Care (Signed)
Immediate Anesthesia Transfer of Care Note  Patient: Autumn Hamilton  Procedure(s) Performed: TOTAL KNEE ARTHROPLASTY (Left Knee)  Patient Location: PACU  Anesthesia Type:MAC combined with regional for post-op pain  Level of Consciousness: awake, alert , oriented and patient cooperative  Airway & Oxygen Therapy: Patient Spontanous Breathing  Post-op Assessment: Report given to RN and Post -op Vital signs reviewed and stable  Post vital signs: Reviewed and stable  Last Vitals:  Vitals:   08/25/17 0548 08/25/17 0559  BP: (!) 170/85 (!) 159/77  Pulse: 85   Resp: 20   Temp: 36.6 C   SpO2: 100%     Last Pain:  Vitals:   08/25/17 0548  PainSc: 7          Complications: No apparent anesthesia complications

## 2017-08-25 NOTE — Discharge Instructions (Signed)

## 2017-08-25 NOTE — Anesthesia Postprocedure Evaluation (Signed)
Anesthesia Post Note  Patient: Gasper SellsMonica V Goatley  Procedure(s) Performed: TOTAL KNEE ARTHROPLASTY (Left Knee)     Patient location during evaluation: PACU Anesthesia Type: Spinal Level of consciousness: oriented and awake and alert Pain management: pain level controlled Vital Signs Assessment: post-procedure vital signs reviewed and stable Respiratory status: spontaneous breathing, respiratory function stable and patient connected to nasal cannula oxygen Cardiovascular status: blood pressure returned to baseline and stable Postop Assessment: no headache, no backache and no apparent nausea or vomiting Anesthetic complications: no    Last Vitals:  Vitals:   08/25/17 1400 08/25/17 1415  BP: 124/62 131/76  Pulse: 84 83  Resp: 15 18  Temp:  36.4 C  SpO2: 100% 99%    Last Pain:  Vitals:   08/25/17 1415  PainSc: 0-No pain                 Tiarah Shisler,JAMES TERRILL

## 2017-08-26 ENCOUNTER — Encounter (HOSPITAL_COMMUNITY): Payer: Self-pay | Admitting: Orthopedic Surgery

## 2017-08-26 DIAGNOSIS — M170 Bilateral primary osteoarthritis of knee: Secondary | ICD-10-CM | POA: Diagnosis not present

## 2017-08-26 NOTE — Discharge Summary (Signed)
Discharge Summary  Patient ID: Autumn Hamilton MRN: [id number] DOB/AGE: 65-06-1951 65 y.o.  Admit date: 08/25/2017 Discharge date: 08/26/2017  Admission Diagnoses:  Primary osteoarthritis of left knee  Discharge Diagnoses:  Principal Problem:   Primary osteoarthritis of left knee Active Problems:   Essential hypertension   Past Medical History:  Diagnosis Date  . Arthritis   . Hypertension     Surgeries: Procedure(s): TOTAL KNEE ARTHROPLASTY on 08/25/2017   Consultants (if any):   Discharged Condition: Improved  Hospital Course: Autumn Hamilton is an 67 y.o. female who was admitted 08/25/2017 with a diagnosis of Primary osteoarthritis of left knee and went to the operating room on 08/25/2017 and underwent the above named procedures.    She was given perioperative antibiotics:  Anti-infectives (From admission, onward)   Start     Dose/Rate Route Frequency Ordered Stop   08/25/17 1600  ceFAZolin (ANCEF) IVPB 1 g/50 mL premix     1 g 100 mL/hr over 30 Minutes Intravenous Every 6 hours 08/25/17 1510 08/26/17 0359   08/25/17 0700  ceFAZolin (ANCEF) IVPB 2g/100 mL premix     2 g 200 mL/hr over 30 Minutes Intravenous To [facility] 08/24/17 1208 08/25/17 0739    .  She was given sequential compression devices, early ambulation, and Aspirin for DVT prophylaxis.  She benefited maximally from the hospital stay and there were no complications.    Recent vital signs:  Vitals:   08/26/17 0354 08/26/17 1333  BP: 111/65 111/60  Pulse: 94 98  Resp: 16   Temp: 98 F (36.7 C) 98.3 F (36.8 C)  SpO2: 100% 97%    Recent laboratory studies:  Lab Results  Component Value Date   HGB 12.3 08/14/2017   Lab Results  Component Value Date   WBC 5.4 08/14/2017   PLT 272 08/14/2017   No results found for: INR Lab Results  Component Value Date   NA 139 08/14/2017   K 3.3 (L) 08/14/2017   CL 102 08/14/2017   CO2 27 08/14/2017   BUN 7 08/14/2017   CREATININE 0.77  08/14/2017   GLUCOSE 140 (H) 08/14/2017    Discharge Medications:   Allergies as of 08/26/2017   No Known Allergies     Medication List    STOP taking these medications   acetaminophen 650 MG CR tablet Commonly known as:  TYLENOL Replaced by:  acetaminophen 500 MG tablet     TAKE these medications   acetaminophen 500 MG tablet Commonly known as:  TYLENOL Take 2 tablets (1,000 mg total) by mouth every 8 (eight) hours for 14 days. For Pain. Replaces:  acetaminophen 650 MG CR tablet   aspirin EC 325 MG tablet Take 1 tablet (325 mg total) by mouth daily. For 30 days post op for DVT Prophylaxis What changed:    medication strength  how much to take  additional instructions   Biotin Plus Keratin 10000-100 MCG-MG Tabs Take 1 capsule by mouth daily.   CITRUCEL 500 MG Tabs Generic drug:  Methylcellulose (Laxative) Take 1,000 mg by mouth daily.   docusate sodium 100 MG capsule Commonly known as:  COLACE Take 1 capsule (100 mg total) by mouth 2 (two) times daily. To prevent constipation while taking pain medication.   DUEXIS 800-26.6 MG Tabs Generic drug:  Ibuprofen-Famotidine Take 1 tablet by mouth 3 (three) times daily as needed (for pain.).   eplerenone 50 MG tablet Commonly known as:  INSPRA Take 100 mg by mouth daily.  KLS ALLERCLEAR 10 MG tablet Generic drug:  loratadine Take 10 mg by mouth daily.   methocarbamol 500 MG tablet Commonly known as:  ROBAXIN Take 1 tablet (500 mg total) by mouth every 6 (six) hours as needed for muscle spasms.   MOVE FREE PO Take 1 tablet by mouth daily.   omeprazole 20 MG capsule Commonly known as:  PRILOSEC Take 1 capsule (20 mg total) by mouth daily. 30 days for gastroprotection while taking NSAIDs.   ondansetron 4 MG tablet Commonly known as:  ZOFRAN Take 1 tablet (4 mg total) by mouth every 8 (eight) hours as needed for nausea or vomiting.   oxyCODONE 5 MG immediate release tablet Commonly known as:   ROXICODONE Take 1-2 tablets (5-10 mg total) by mouth every 4 (four) hours as needed for breakthrough pain.   TURMERIC CURCUMIN PO Take 500 mg by mouth daily.   verapamil 120 MG CR tablet Commonly known as:  CALAN-SR Take 120 mg by mouth daily.       Diagnostic Studies: Dg Knee Left Port  Result Date: 08/25/2017 CLINICAL DATA:  Status post left knee arthroplasty. EXAM: PORTABLE LEFT KNEE - 1-2 VIEW COMPARISON:  None. FINDINGS: The femoral, tibial and patellar components appear to be well situated. Expected postoperative changes are noted in the anterior soft tissues. IMPRESSION: Status post left total knee arthroplasty. Electronically Signed   By: Lupita RaiderJames  Green Jr, M.D.   On: 08/25/2017 10:40    Disposition: Home w/ HHPT  Discharge Instructions    Discharge patient   Complete by:  As directed    In the P.M. After therapy sessions.   Discharge disposition:  01-Home or Self Care   Discharge patient date:  08/26/2017      Follow-up Information    Sheral ApleyMurphy, Timothy D, MD Follow up.   Specialty:  Orthopedic Surgery Contact information: [address] [place] [address] [phone]        [facility] Follow up.   Specialty:  Home Health Services Why:  A representative from [facility] will contact you to arrange start date and time for your therapy. Contact information: [address] [place] [address] [place] [address] [phone]            Signed: Albina BilletHenry Calvin Martensen III PA-C 08/26/2017, 5:53 PM

## 2017-08-26 NOTE — Progress Notes (Signed)
Physical Therapy Treatment Patient Details Name: Autumn Hamilton MRN: [id number] DOB: 12-Jan-1951 Today's Date: 08/26/2017    History of Present Illness 67 y.o. year old female with end stage bone on bone degenerative arthritis of the L knee, s/p L TKA 08/25/17 . PMH of HTN    PT Comments    Patient is progressing well toward mobility goals. Pt tolerated gait and stair training this session. HEP next session. Current plan remains appropriate.    Follow Up Recommendations  Home health PT;Supervision/Assistance - 24 hour     Equipment Recommendations  None recommended by PT    Recommendations for Other Services       Precautions / Restrictions Precautions Precautions: Knee Precaution Booklet Issued: Yes (comment) Precaution Comments: reviewed precautions/positioning with pt Restrictions Weight Bearing Restrictions: Yes LLE Weight Bearing: Weight bearing as tolerated    Mobility  Bed Mobility               General bed mobility comments: pt sitting EOB upon arrival  Transfers Overall transfer level: Needs assistance Equipment used: Rolling walker (2 wheeled) Transfers: Sit to/from Stand Sit to Stand: Min guard         General transfer comment: min guard for safety  Ambulation/Gait Ambulation/Gait assistance: Min guard Ambulation Distance (Feet): 150 Feet Assistive device: Rolling walker (2 wheeled) Gait Pattern/deviations: Step-through pattern;Decreased stance time - left;Decreased step length - right Gait velocity: decreased   General Gait Details: cues for sequencing and proximity to [employer] Stairs: Yes   Stair Management: One rail Right;Step to pattern;Sideways;No rails;Backwards;With walker Number of Stairs: (4 steps with R rail and 2 steps backwards with RW) General stair comments: cues for sequencing and technique; assist to stabilize RW  Wheelchair Mobility    Modified Rankin (Stroke Patients Only)       Balance Overall balance  assessment: Needs assistance Sitting-balance support: No upper extremity supported;Feet supported Sitting balance-Leahy Scale: Normal     Standing balance support: No upper extremity supported;During functional activity Standing balance-Leahy Scale: Fair                              Cognition Arousal/Alertness: Awake/alert Behavior During Therapy: WFL for tasks assessed/performed Overall Cognitive Status: Within Functional Limits for tasks assessed                                        Exercises      General Comments        Pertinent Vitals/Pain Pain Assessment: Faces Faces Pain Scale: Hurts a little bit Pain Location: L knee Pain Descriptors / Indicators: Aching Pain Intervention(s): Monitored during session;Repositioned;Patient requesting pain meds-RN notified;Limited activity within patient's tolerance    Home Living                      Prior Function            PT Goals (current goals can now be found in the care plan section) Acute Rehab PT Goals Patient Stated Goal: get back to dancing PT Goal Formulation: With patient Time For Goal Achievement: 09/08/17 Potential to Achieve Goals: Good Progress towards PT goals: Progressing toward goals    Frequency    7X/week      PT Plan Current plan remains appropriate    Co-evaluation  AM-PAC PT "6 Clicks" Daily Activity  Outcome Measure  Difficulty turning over in bed (including adjusting bedclothes, sheets and blankets)?: A Little Difficulty moving from lying on back to sitting on the side of the bed? : A Little Difficulty sitting down on and standing up from a chair with arms (e.g., wheelchair, bedside commode, etc,.)?: Unable Help needed moving to and from a bed to chair (including a wheelchair)?: None Help needed walking in hospital room?: A Little Help needed climbing 3-5 steps with a railing? : A Little 6 Click Score: 17    End of Session  Equipment Utilized During Treatment: Gait belt Activity Tolerance: Patient tolerated treatment well Patient left: in chair;with call bell/phone within reach Nurse Communication: Mobility status PT Visit Diagnosis: Other abnormalities of gait and mobility (R26.89);Difficulty in walking, not elsewhere classified (R26.2)     Time: [id number] PT Time Calculation (min) (ACUTE ONLY): 25 min  Charges:  $Gait Training: 23-37 mins                    G Codes:       Erline LevineKellyn Rylie Knierim, PTA Pager: [phone]     Carolynne EdouardKellyn R Maridee Slape 08/26/2017, 10:03 AM

## 2017-08-26 NOTE — Progress Notes (Signed)
   Assessment / Plan: 1 Day Post-Op  S/P Procedure(s) (LRB): TOTAL KNEE ARTHROPLASTY (Left) by Dr. Jewel Baizeimothy D. Murphy on 08/25/17  Principal Problem:   Primary osteoarthritis of left knee Active Problems:   Essential hypertension   Primary OA Left Knee Doing well POD1 Eating, drinking, voiding, and mobilizing.   Pain Controlled.  Up with therapy Incentive Spirometry Elevate and apply ice  Weight Bearing: Weight Bearing as Tolerated (WBAT) LLE  Dressings: Maintain Mepilex.  VTE prophylaxis: Aspirin, SCDs, ambulation Dispo: Home today after therapy sessions  Subjective: Patient reports pain as mild to moderate, controlled.  Tolerating diet.  Urinating.  No CP, SOB.  OOB.  Objective:   VITALS:   Vitals:   08/25/17 1655 08/25/17 2021 08/26/17 0036 08/26/17 0354  BP: (!) 148/69 (!) 119/55 126/77 111/65  Pulse: (!) 111 (!) 103 (!) 111 94  Resp:  16 16 16   Temp: 97.9 F (36.6 C) 97.7 F (36.5 C) 98.1 F (36.7 C) 98 F (36.7 C)  TempSrc: Oral Oral Oral Oral  SpO2: 94% 92% 100% 100%  Weight:      Height:       CBC Latest Ref Rng & Units 08/14/2017  WBC 4.0 - 10.5 K/uL 5.4  Hemoglobin 12.0 - 15.0 g/dL 96.012.3  Hematocrit 45.436.0 - 46.0 % 37.9  Platelets 150 - 400 K/uL 272   BMP Latest Ref Rng & Units 08/14/2017  Glucose 65 - 99 mg/dL 098(J140(H)  BUN 6 - 20 mg/dL 7  Creatinine 1.910.44 - 4.781.00 mg/dL 2.950.77  Sodium 621135 - 308145 mmol/L 139  Potassium 3.5 - 5.1 mmol/L 3.3(L)  Chloride 101 - 111 mmol/L 102  CO2 22 - 32 mmol/L 27  Calcium 8.9 - 10.3 mg/dL 9.4   Intake/Output      01/15 0701 - 01/16 0700 01/16 0701 - 01/17 0700   P.O. 560    I.V. (mL/kg) 1200 (13.6)    Other 600    Total Intake(mL/kg) 2360 (26.8)    Urine (mL/kg/hr) 0 (0)    Stool 0    Blood 50    Total Output 50    Net +2310         Urine Occurrence 2 x    Stool Occurrence 0 x      Physical Exam: General: NAD.  Supine in bed.  Calm, conversant.  CPM in place. Resp: No increased wob Cardio: regular rate and  rhythm ABD soft Neurologically intact MSK Neurovascularly intact Sensation intact distally Feet warm  Dorsiflexion/Plantar flexion intact Incision: dressing C/D/I   Albina BilletHenry Calvin Martensen III, PA-C 08/26/2017, 7:19 AM

## 2017-08-26 NOTE — Care Management Note (Signed)
Case Management Note  Patient Details  Name: Autumn Hamilton MRN: [id number] Date of Birth: 30-Jul-1951  Subjective/Objective:  67 yr old female s/p  Left total knee arthroplasty.                 Action/Plan: Patient was preoperatively setup with [facility], no changes. DME has been delivered to her home. Patient will have support at discharge.    Expected Discharge Date:  08/26/17               Expected Discharge Plan:  Home w Home Health Services  In-House Referral:  NA  Discharge planning Services  CM Consult  Post Acute Care Choice:  Durable Medical Equipment, Home Health Choice offered to:  Patient  DME Arranged:  3-N-1, CPM, Walker rolling DME Agency:  [employer]  HH Arranged:  PT HH [facility]  [facility]  Status of Service:  Completed, signed off  If discussed at [employer] Meetings, dates discussed:    Additional Comments:  Durenda GuthrieBrady, Khayden Herzberg Naomi, RN 08/26/2017, 1:55 PM

## 2017-08-26 NOTE — Progress Notes (Signed)
Physical Therapy Treatment Patient Details Name: Gasper SellsMonica V Abend MRN: [id number] DOB: 1950/10/23 Today's Date: 08/26/2017    History of Present Illness 67 y.o. year old female with end stage bone on bone degenerative arthritis of the L knee, s/p L TKA 08/25/17 . PMH of HTN    PT Comments    Patient is making good progress with PT.  From a mobility standpoint anticipate patient will be ready for DC home when medically.    Follow Up Recommendations  Home health PT;Supervision/Assistance - 24 hour     Equipment Recommendations  None recommended by PT    Recommendations for Other Services       Precautions / Restrictions Precautions Precautions: Knee Precaution Booklet Issued: Yes (comment) Precaution Comments: reviewed precautions/positioning with pt Restrictions Weight Bearing Restrictions: Yes LLE Weight Bearing: Weight bearing as tolerated    Mobility  Bed Mobility Overal bed mobility: Modified Independent                Transfers Overall transfer level: Needs assistance Equipment used: Rolling walker (2 wheeled) Transfers: Sit to/from Stand Sit to Stand: Min guard         General transfer comment: min guard for safety  Ambulation/Gait Ambulation/Gait assistance: Min guard Ambulation Distance (Feet): 14 Feet Assistive device: Rolling walker (2 wheeled) Gait Pattern/deviations: Step-through pattern;Decreased stance time - left;Decreased step length - right Gait velocity: decreased       Stairs            Wheelchair Mobility    Modified Rankin (Stroke Patients Only)       Balance Overall balance assessment: Needs assistance Sitting-balance support: No upper extremity supported;Feet supported Sitting balance-Leahy Scale: Normal     Standing balance support: No upper extremity supported;During functional activity Standing balance-Leahy Scale: Fair                              Cognition Arousal/Alertness:  Awake/alert Behavior During Therapy: WFL for tasks assessed/performed Overall Cognitive Status: Within Functional Limits for tasks assessed                                        Exercises Total Joint Exercises Ankle Circles/Pumps: AROM;Both;10 reps Quad Sets: AROM;Both;10 reps Short Arc Quad: AROM;Left;10 reps Heel Slides: AAROM;Left;10 reps Hip ABduction/ADduction: AROM;Left;10 reps Straight Leg Raises: AROM;Left;10 reps Long Arc Quad: AROM;Left;10 reps;Seated Knee Flexion: AROM;Left;10 reps;Seated Goniometric ROM: 80 degrees flexion    General Comments General comments (skin integrity, edema, etc.): family present      Pertinent Vitals/Pain Pain Assessment: Faces Faces Pain Scale: Hurts little more Pain Location: L knee Pain Descriptors / Indicators: Aching;Grimacing;Sore Pain Intervention(s): Limited activity within patient's tolerance;Monitored during session;Repositioned;Patient requesting pain meds-RN notified    Home Living                      Prior Function            PT Goals (current goals can now be found in the care plan section) Acute Rehab PT Goals Patient Stated Goal: get back to dancing PT Goal Formulation: With patient Time For Goal Achievement: 09/08/17 Potential to Achieve Goals: Good Progress towards PT goals: Progressing toward goals    Frequency    7X/week      PT Plan Current plan remains appropriate    Co-evaluation  AM-PAC PT "6 Clicks" Daily Activity  Outcome Measure  Difficulty turning over in bed (including adjusting bedclothes, sheets and blankets)?: A Little Difficulty moving from lying on back to sitting on the side of the bed? : A Little Difficulty sitting down on and standing up from a chair with arms (e.g., wheelchair, bedside commode, etc,.)?: Unable Help needed moving to and from a bed to chair (including a wheelchair)?: None Help needed walking in hospital room?: A  Little Help needed climbing 3-5 steps with a railing? : A Little 6 Click Score: 17    End of Session Equipment Utilized During Treatment: Gait belt Activity Tolerance: Patient tolerated treatment well Patient left: in chair;with call bell/phone within reach;with family/visitor present Nurse Communication: Mobility status PT Visit Diagnosis: Other abnormalities of gait and mobility (R26.89);Difficulty in walking, not elsewhere classified (R26.2)     Time: 1350-1420 PT Time Calculation (min) (ACUTE ONLY): 30 min  Charges:  $Therapeutic Exercise: 23-37 mins                    G Codes:       Erline Levine, PTA Pager: [phone]     Carolynne Edouard 08/26/2017, 2:29 PM

## 2017-08-26 NOTE — Progress Notes (Deleted)
Discharge Summary  Patient ID: Autumn Hamilton MRN: [id number] DOB/AGE: [birth year] 67 y.o.  Admit date: 08/25/2017 Discharge date: 08/26/2017  Admission Diagnoses:  Primary osteoarthritis of left knee  Discharge Diagnoses:  Principal Problem:   Primary osteoarthritis of left knee Active Problems:   Essential hypertension   Past Medical History:  Diagnosis Date  . Arthritis   . Hypertension     Surgeries: Procedure(s): TOTAL KNEE ARTHROPLASTY on 08/25/2017   Consultants (if any):   Discharged Condition: Improved  Hospital Course: Autumn Hamilton is an 67 y.o. female who was admitted 08/25/2017 with a diagnosis of Primary osteoarthritis of left knee and went to the operating room on 08/25/2017 and underwent the above named procedures.    She was given perioperative antibiotics:  Anti-infectives (From admission, onward)   Start     Dose/Rate Route Frequency Ordered Stop   08/25/17 1600  ceFAZolin (ANCEF) IVPB 1 g/50 mL premix     1 g 100 mL/hr over 30 Minutes Intravenous Every 6 hours 08/25/17 1510 08/26/17 0359   08/25/17 0700  ceFAZolin (ANCEF) IVPB 2g/100 mL premix     2 g 200 mL/hr over 30 Minutes Intravenous To [facility]Stay [facility] 08/24/17 1208 08/25/17 0739    .  She was given sequential compression devices, early ambulation, and Aspirin for DVT prophylaxis.  She benefited maximally from the hospital stay and there were no complications.    Recent vital signs:  Vitals:   08/26/17 0036 08/26/17 0354  BP: 126/77 111/65  Pulse: (!) 111 94  Resp: 16 16  Temp: 98.1 F (36.7 C) 98 F (36.7 C)  SpO2: 100% 100%    Recent laboratory studies:  Lab Results  Component Value Date   HGB 12.3 08/14/2017   Lab Results  Component Value Date   WBC 5.4 08/14/2017   PLT 272 08/14/2017   No results found for: INR Lab Results  Component Value Date   NA 139 08/14/2017   K 3.3 (L) 08/14/2017   CL 102 08/14/2017   CO2 27 08/14/2017   BUN 7 08/14/2017   CREATININE  0.77 08/14/2017   GLUCOSE 140 (H) 08/14/2017    Discharge Medications:   Allergies as of 08/26/2017   No Known Allergies     Medication List    STOP taking these medications   acetaminophen 650 MG CR tablet Commonly known as:  TYLENOL Replaced by:  acetaminophen 500 MG tablet     TAKE these medications   acetaminophen 500 MG tablet Commonly known as:  TYLENOL Take 2 tablets (1,000 mg total) by mouth every 8 (eight) hours for 14 days. For Pain. Replaces:  acetaminophen 650 MG CR tablet   aspirin EC 325 MG tablet Take 1 tablet (325 mg total) by mouth daily. For 30 days post op for DVT Prophylaxis What changed:    medication strength  how much to take  additional instructions   Biotin Plus Keratin 10000-100 MCG-MG Tabs Take 1 capsule by mouth daily.   CITRUCEL 500 MG Tabs Generic drug:  Methylcellulose (Laxative) Take 1,000 mg by mouth daily.   docusate sodium 100 MG capsule Commonly known as:  COLACE Take 1 capsule (100 mg total) by mouth 2 (two) times daily. To prevent constipation while taking pain medication.   DUEXIS 800-26.6 MG Tabs Generic drug:  Ibuprofen-Famotidine Take 1 tablet by mouth 3 (three) times daily as needed (for pain.).   eplerenone 50 MG tablet Commonly known as:  INSPRA Take 100 mg by mouth daily.  KLS ALLERCLEAR 10 MG tablet Generic drug:  loratadine Take 10 mg by mouth daily.   methocarbamol 500 MG tablet Commonly known as:  ROBAXIN Take 1 tablet (500 mg total) by mouth every 6 (six) hours as needed for muscle spasms.   MOVE FREE PO Take 1 tablet by mouth daily.   omeprazole 20 MG capsule Commonly known as:  PRILOSEC Take 1 capsule (20 mg total) by mouth daily. 30 days for gastroprotection while taking NSAIDs.   ondansetron 4 MG tablet Commonly known as:  ZOFRAN Take 1 tablet (4 mg total) by mouth every 8 (eight) hours as needed for nausea or vomiting.   oxyCODONE 5 MG immediate release tablet Commonly known as:   ROXICODONE Take 1-2 tablets (5-10 mg total) by mouth every 4 (four) hours as needed for breakthrough pain.   TURMERIC CURCUMIN PO Take 500 mg by mouth daily.   verapamil 120 MG CR tablet Commonly known as:  CALAN-SR Take 120 mg by mouth daily.       Diagnostic Studies: Dg Knee Left Port  Result Date: 08/25/2017 CLINICAL DATA:  Status post left knee arthroplasty. EXAM: PORTABLE LEFT KNEE - 1-2 VIEW COMPARISON:  None. FINDINGS: The femoral, tibial and patellar components appear to be well situated. Expected postoperative changes are noted in the anterior soft tissues. IMPRESSION: Status post left total knee arthroplasty. Electronically Signed   By: Lupita RaiderJames  Green Jr, M.D.   On: 08/25/2017 10:40    Disposition: Home w/ HHPT  Discharge Instructions    Discharge patient   Complete by:  As directed    In the P.M. After therapy sessions.   Discharge disposition:  01-Home or Self Care   Discharge patient date:  08/26/2017      Follow-up Information    Sheral ApleyMurphy, Timothy D, MD.   Specialty:  Orthopedic Surgery Contact information: [address] [place] [address] [phone]            Signed: Albina BilletHenry Calvin Martensen III PA-C 08/26/2017, 7:23 AM

## 2017-08-26 NOTE — Progress Notes (Signed)
Discharge instructions and prescriptions reviewed with paitnet and family, verbalized understanding. IV removed, catheter tip intact. No questions/concerns at this time. Awaiting wheelchair transport to lobby for discharge.

## 2017-12-21 DIAGNOSIS — M1711 Unilateral primary osteoarthritis, right knee: Secondary | ICD-10-CM | POA: Diagnosis present

## 2017-12-21 NOTE — H&P (Signed)
KNEE ARTHROPLASTY ADMISSION H&P  Patient ID: EVANTHIA MAUND MRN: [id number] DOB/AGE: 12/05/1950 67 y.o.  Chief Complaint: right knee pain.  Planned Procedure Date: 01/12/18 Medical and Cardiac Clearance by Dr. Modesto Charon     HPI: Autumn Hamilton is a 67 y.o. female with a history of HTN and Left TKA 08/25/2017 who presents for evaluation of OA RIGHT KNEE. The patient has a history of pain and functional disability in the right knee due to arthritis and has failed non-surgical conservative treatments for greater than 12 weeks to include NSAID's and/or analgesics, corticosteriod injections, viscosupplementation injections and activity modification.  Onset of symptoms was gradual, starting 2 years ago with gradually worsening course since that time.  Patient currently rates pain at 8 out of 10 with activity. Patient has night pain, worsening of pain with activity and weight bearing and pain that interferes with activities of daily living.  Patient has evidence of subchondral cysts, subchondral sclerosis, periarticular osteophytes and joint space narrowing by imaging studies.  There is no active infection.  Past medical history: Hypertension  Medications: Duexis 800/26.6 mg, 1 up to 3 times daily as needed Biotin 10,000 mcg with keratin 100 mg.  1 daily. Aspirin 81 mg daily. Citrucel 2 capsules daily. Eplerenone 50 mg 2 tablets daily Verapamil ER 120 mg.  1 daily. Move Free joint cartilage bone supplement.  1 daily. AllerClear 10 mg antihistamine 1 daily. Tumeric curcumin 500/50 mg daily.  Surgical history: Partial hysterectomy 1995 Right knee arthroscopy, meniscectomy 2004 Left knee arthroscopy, meniscectomy 2006 Left shoulder biceps tear 2009 Tonsillectomy/adenoidectomy 1969 Left Total Knee Arthroplasty 08/25/2017  Allergies: NKDA  Social history:  Married never smoker no EtOH.  Retired from [employer].  Family history: Mother with HTN, cancer.  Sister with HTN, DM.   ROS: Currently  denies lightheadedness, dizziness, Fever, chills, CP, SOB.   No personal history of DVT, PE, MI, or CVA. No loose teeth or dentures All other systems have been reviewed and were otherwise currently negative with the exception of those mentioned in the HPI and as above.  Objective: Vitals: Ht: 5'3" Wt: 185 Temp: 97.8 BP: 129/82 Pulse: 98 O2 98% on room air.   Physical Exam: General: Alert, NAD.  Antalgic Gait  HEENT: EOMI, Good Neck Extension  Pulm: No increased work of breathing.  Clear B/L A/P w/o crackle or wheeze.  CV: RRR, No m/g/r appreciated  GI: soft, NT, ND Neuro: Neuro without gross focal deficit.  Sensation intact distally Skin: No lesions in the area of chief complaint MSK/Surgical Site: Right knee w/o redness or effusion.  + JLT. ROM 0-90.  5/5 strength in extension and flexion.  +EHL/FHL.  NVI.  Stable varus and valgus stress.    Imaging Review Plain radiographs demonstrate severe degenerative joint disease of the right knee.    Preoperative templating of the joint replacement has been completed, documented, and submitted to the Operating Room personnel in order to optimize intra-operative equipment management.  Assessment: OA RIGHT KNEE Principal Problem:   Primary osteoarthritis of right knee Active Problems:   Essential hypertension   Plan: Plan for Procedure(s): TOTAL KNEE ARTHROPLASTY  The patient history, physical exam, clinical judgement of the provider and imaging are consistent with end stage degenerative joint disease and total joint arthroplasty is deemed medically necessary. The treatment options including medical management, injection therapy, and arthroplasty were discussed at length. The risks and benefits of Procedure(s): TOTAL KNEE ARTHROPLASTY were presented and reviewed.  The risks of nonoperative treatment, versus surgical intervention  including but not limited to continued pain, aseptic loosening, stiffness, dislocation/subluxation, infection,  bleeding, nerve injury, blood clots, cardiopulmonary complications, morbidity, mortality, among others were discussed. The patient verbalizes understanding and wishes to proceed with the plan.  Patient is being admitted for inpatient treatment for surgery, pain control, PT, OT, prophylactic antibiotics, VTE prophylaxis, progressive ambulation, ADL's and discharge planning.   Dental prophylaxis discussed and recommended for 2 years postoperatively.   The patient does meet the criteria for TXA which will be used perioperatively.    ASA 81 mg BID will be used postoperatively for DVT prophylaxis in addition to SCDs, and early ambulation.  The patient is planning to be discharged home with home health services ([facility]) in care of her husband  Oxycodone/Tylenol for pain.  She will continue Duexis postoperatively after discharge.   Patient's anticipated LOS is less than 2 midnights, meeting these requirements: - Younger than 16 - Lives within 1 hour of care - Has a competent adult at home to recover with post-op recover - NO history of  - Chronic pain requiring opiods  - Diabetes  - Coronary Artery Disease  - Heart failure  - Heart attack  - Stroke  - DVT/VTE  - Cardiac arrhythmia  - Respiratory Failure/COPD  - Renal failure  - Anemia  - Advanced Liver disease   Albina Billet III, PA-C 12/21/2017 8:19 AM

## 2017-12-29 NOTE — Pre-Procedure Instructions (Signed)
BRANDILEE PIES  12/29/2017      [id number] - [place], [place] - [address] AT [address] OF [facility] [address] [place] [address] Phone: [phone] Fax: [phone]    Your procedure is scheduled on Tues., January 12, 2018 from 7:30AM-9:30AM  Report to [facility] Admitting Entrance "A" at 5:30AM  Call this number if you have problems the morning of surgery:  [phone]   Remember:  No food after midnight on June 3rd  Take these medicines the morning of surgery with A SIP OF WATER: Eplerenone (INSPRA) and Loratadine (KLS ALLERCLEAR). If needed Acetaminophen (TYLENOL) for pain  Follow your doctors instructions regarding your Aspirin.  If no instructions were given by your doctor, then you will need to call the prescribing office office to get instructions.    7 days before surgery (5/28), stop taking all Other Aspirin Products, Vitamins, Fish oils, and Herbal medications. Also stop all NSAIDS i.e. Advil, Ibuprofen, Motrin, Aleve, Anaprox, Naproxen, BC and Goody Powders. Including: Biotin and Glucosamine-Chondroitin    Do not wear jewelry, make-up or nail polish.  Do not wear lotions, powders, or perfumes, or deodorant.  Do not shave 48 hours prior to surgery.   Do not bring valuables to the hospital.  [facility] is not responsible for any belongings or valuables.  Contacts, dentures or bridgework may not be worn into surgery.  Leave your suitcase in the car.  After surgery it may be brought to your room.  For patients admitted to the hospital, discharge time will be determined by your treatment team.  Patients discharged the day of surgery will not be allowed to drive home.   Special instructions:  [place]- Preparing For Surgery  Before surgery, you can play an important role. Because skin is not sterile, your skin needs to be as free of germs as possible. You can reduce the number of germs on your skin by washing with  CHG (chlorahexidine gluconate) Soap before surgery.  CHG is an antiseptic cleaner which kills germs and bonds with the skin to continue killing germs even after washing.    Oral Hygiene is also important to reduce your risk of infection.  Remember - BRUSH YOUR TEETH THE MORNING OF SURGERY WITH YOUR REGULAR TOOTHPASTE  Please do not use if you have an allergy to CHG or antibacterial soaps. If your skin becomes reddened/irritated stop using the CHG.  Do not shave (including legs and underarms) for at least 48 hours prior to first CHG shower. It is OK to shave your face.  Please follow these instructions carefully.   1. Shower the NIGHT BEFORE SURGERY and the MORNING OF SURGERY with CHG.   2. If you chose to wash your hair, wash your hair first as usual with your normal shampoo.  3. After you shampoo, rinse your hair and body thoroughly to remove the shampoo.  4. Use CHG as you would any other liquid soap. You can apply CHG directly to the skin and wash gently with a scrungie or a clean washcloth.   5. Apply the CHG Soap to your body ONLY FROM THE NECK DOWN.  Do not use on open wounds or open sores. Avoid contact with your eyes, ears, mouth and genitals (private parts). Wash Face and genitals (private parts)  with your normal soap.  6. Wash thoroughly, paying special attention to the area where your surgery will be performed.  7. Thoroughly rinse your body with warm water  from the neck down.  8. DO NOT shower/wash with your normal soap after using and rinsing off the CHG Soap.  9. Pat yourself dry with a CLEAN TOWEL.  10. Wear CLEAN PAJAMAS to bed the night before surgery, wear comfortable clothes the morning of surgery  11. Place CLEAN SHEETS on your bed the night of your first shower and DO NOT SLEEP WITH PETS.  Day of Surgery:  Do not apply any deodorants/lotions.  Please wear clean clothes to the hospital/surgery center.   Remember to brush your teeth WITH YOUR REGULAR  TOOTHPASTE.  Please read over the following fact sheets that you were given. Pain Booklet, Coughing and Deep Breathing, MRSA Information and Surgical Site Infection Prevention

## 2017-12-30 ENCOUNTER — Encounter (HOSPITAL_COMMUNITY): Payer: Self-pay

## 2017-12-30 ENCOUNTER — Encounter (HOSPITAL_COMMUNITY)
Admission: RE | Admit: 2017-12-30 | Discharge: 2017-12-30 | Disposition: A | Discharge status: Home / Self Care | Payer: 59 | Source: Ambulatory Visit | Attending: Orthopedic Surgery | Admitting: Orthopedic Surgery

## 2017-12-30 ENCOUNTER — Other Ambulatory Visit: Payer: Self-pay

## 2017-12-30 DIAGNOSIS — Z01812 Encounter for preprocedural laboratory examination: Secondary | ICD-10-CM | POA: Diagnosis not present

## 2017-12-30 HISTORY — DX: Unilateral primary osteoarthritis, unspecified knee: M17.10

## 2017-12-30 LAB — CBC
HCT: 37.5 % (ref 36.0–46.0)
Hemoglobin: 11.9 g/dL — ABNORMAL LOW (ref 12.0–15.0)
MCH: 29 pg (ref 26.0–34.0)
MCHC: 31.7 g/dL (ref 30.0–36.0)
MCV: 91.2 fL (ref 78.0–100.0)
PLATELETS: 275 10*3/uL (ref 150–400)
RBC: 4.11 MIL/uL (ref 3.87–5.11)
RDW: 14.2 % (ref 11.5–15.5)
WBC: 5.7 10*3/uL (ref 4.0–10.5)

## 2017-12-30 LAB — BASIC METABOLIC PANEL
Anion gap: 10 (ref 5–15)
BUN: 12 mg/dL (ref 6–20)
CALCIUM: 9.6 mg/dL (ref 8.9–10.3)
CHLORIDE: 107 mmol/L (ref 101–111)
CO2: 25 mmol/L (ref 22–32)
CREATININE: 0.73 mg/dL (ref 0.44–1.00)
GFR calc non Af Amer: >60 mL/min (ref >60)
GLUCOSE: 96 mg/dL (ref 65–99)
Potassium: 3.7 mmol/L (ref 3.5–5.1)
Sodium: 142 mmol/L (ref 135–145)

## 2017-12-30 LAB — SURGICAL PCR SCREEN
MRSA, PCR: NEGATIVE
Staphylococcus aureus: NEGATIVE

## 2017-12-30 NOTE — Progress Notes (Addendum)
[facility]  Cardiologist - Denies  Chest x-ray - Denies  EKG - 08/2017-Requested- DOS if not received  Stress Test - Denies  ECHO - Denies  Cardiac Cath - Denies  Sleep Study - Denies CPAP - None  LABS- 12/30/17: CBC, BMP  ASA- LD- 5/28   Anesthesia- Yes- Requested EKG  Pt denies having chest pain, sob, or fever at this time. All instructions explained to the pt, with a verbal understanding of the material. Pt agrees to go over the instructions while at home for a better understanding. The opportunity to ask questions was provided.

## 2018-01-01 ENCOUNTER — Other Ambulatory Visit (HOSPITAL_COMMUNITY): Payer: 59

## 2018-01-11 MED ORDER — TRANEXAMIC ACID 1000 MG/10ML IV SOLN
1000.0000 mg | INTRAVENOUS | Status: AC
  Administered 2018-01-12: 1000 mg via INTRAVENOUS
  Filled 2018-01-11: qty 1100

## 2018-01-11 MED ORDER — BUPIVACAINE LIPOSOME 1.3 % IJ SUSP
20.0000 mL | Freq: Once | INTRAMUSCULAR | Status: DC
  Filled 2018-01-11: qty 20

## 2018-01-11 NOTE — Anesthesia Preprocedure Evaluation (Signed)
Anesthesia Evaluation  Patient identified by MRN, date of birth, ID band Patient awake    Reviewed: Allergy & Precautions, NPO status , Patient's Chart, lab work & pertinent test results  Airway Mallampati: II   Neck ROM: Full    Dental   Pulmonary neg pulmonary ROS,    breath sounds clear to auscultation       Cardiovascular hypertension,  Rhythm:Regular Rate:Normal     Neuro/Psych negative neurological ROS     GI/Hepatic negative GI ROS, Neg liver ROS,   Endo/Other  negative endocrine ROS  Renal/GU negative Renal ROS     Musculoskeletal  (+) Arthritis ,   Abdominal   Peds  Hematology   Anesthesia Other Findings   Reproductive/Obstetrics                             Anesthesia Physical  Anesthesia Plan  ASA: II  Anesthesia Plan: Spinal   Post-op Pain Management:  Regional for Post-op pain   Induction: Intravenous  PONV Risk Score and Plan: 3 and Ondansetron, Dexamethasone and Treatment may vary due to age or medical condition  Airway Management Planned: Natural Airway  Additional Equipment:   Intra-op Plan:   Post-operative Plan:   Informed Consent: I have reviewed the patients History and Physical, chart, labs and discussed the procedure including the risks, benefits and alternatives for the proposed anesthesia with the patient or authorized representative who has indicated his/her understanding and acceptance.     Plan Discussed with: CRNA  Anesthesia Plan Comments:         Anesthesia Quick Evaluation

## 2018-01-12 ENCOUNTER — Encounter (HOSPITAL_COMMUNITY)
Admission: RE | Disposition: A | Discharge status: Home / Self Care | Payer: Self-pay | Source: Ambulatory Visit | Attending: Orthopedic Surgery

## 2018-01-12 ENCOUNTER — Other Ambulatory Visit: Payer: Self-pay

## 2018-01-12 ENCOUNTER — Inpatient Hospital Stay (HOSPITAL_COMMUNITY): Payer: 59 | Admitting: Emergency Medicine

## 2018-01-12 ENCOUNTER — Observation Stay (HOSPITAL_COMMUNITY): Payer: 59

## 2018-01-12 ENCOUNTER — Inpatient Hospital Stay (HOSPITAL_COMMUNITY): Payer: 59 | Admitting: Certified Registered"

## 2018-01-12 ENCOUNTER — Encounter (HOSPITAL_COMMUNITY): Payer: Self-pay

## 2018-01-12 ENCOUNTER — Observation Stay (HOSPITAL_COMMUNITY)
Admission: RE | Admit: 2018-01-12 | Discharge: 2018-01-13 | Disposition: A | Discharge status: Home / Self Care | Payer: 59 | Source: Ambulatory Visit | Attending: Orthopedic Surgery | Admitting: Orthopedic Surgery

## 2018-01-12 DIAGNOSIS — Z7982 Long term (current) use of aspirin: Secondary | ICD-10-CM | POA: Insufficient documentation

## 2018-01-12 DIAGNOSIS — Z96659 Presence of unspecified artificial knee joint: Secondary | ICD-10-CM

## 2018-01-12 DIAGNOSIS — Z96652 Presence of left artificial knee joint: Secondary | ICD-10-CM | POA: Insufficient documentation

## 2018-01-12 DIAGNOSIS — I10 Essential (primary) hypertension: Secondary | ICD-10-CM | POA: Diagnosis not present

## 2018-01-12 DIAGNOSIS — M1710 Unilateral primary osteoarthritis, unspecified knee: Secondary | ICD-10-CM | POA: Diagnosis present

## 2018-01-12 DIAGNOSIS — M1711 Unilateral primary osteoarthritis, right knee: Secondary | ICD-10-CM | POA: Diagnosis not present

## 2018-01-12 DIAGNOSIS — Z79899 Other long term (current) drug therapy: Secondary | ICD-10-CM | POA: Diagnosis not present

## 2018-01-12 HISTORY — PX: TOTAL KNEE ARTHROPLASTY: SHX125

## 2018-01-12 SURGERY — ARTHROPLASTY, KNEE, TOTAL
Anesthesia: Spinal | Site: Knee | Laterality: Right

## 2018-01-12 MED ORDER — METHOCARBAMOL 500 MG PO TABS
500.0000 mg | ORAL_TABLET | Freq: Four times a day (QID) | ORAL | Status: DC | PRN
  Administered 2018-01-12 – 2018-01-13 (×2): 500 mg via ORAL
  Filled 2018-01-12 (×2): qty 1

## 2018-01-12 MED ORDER — OXYCODONE HCL 5 MG/5ML PO SOLN: 5.0000 mg | Freq: Once | ORAL | Status: DC | PRN

## 2018-01-12 MED ORDER — SORBITOL 70 % SOLN: 30.0000 mL | Freq: Every day | Status: DC | PRN

## 2018-01-12 MED ORDER — BUPIVACAINE IN DEXTROSE 0.75-8.25 % IT SOLN
INTRATHECAL | Status: DC | PRN
  Administered 2018-01-12: 1.6 mL via INTRATHECAL

## 2018-01-12 MED ORDER — ACETAMINOPHEN 160 MG/5ML PO SOLN: 325.0000 mg | ORAL | Status: DC | PRN

## 2018-01-12 MED ORDER — GABAPENTIN 300 MG PO CAPS
300.0000 mg | ORAL_CAPSULE | Freq: Once | ORAL | Status: AC
  Administered 2018-01-12: 300 mg via ORAL
  Filled 2018-01-12: qty 1

## 2018-01-12 MED ORDER — POLYETHYLENE GLYCOL 3350 17 G PO PACK: 17.0000 g | PACK | Freq: Every day | ORAL | Status: DC | PRN

## 2018-01-12 MED ORDER — ASPIRIN 81 MG PO CHEW
81.0000 mg | CHEWABLE_TABLET | Freq: Two times a day (BID) | ORAL | Status: DC
  Administered 2018-01-12 – 2018-01-13 (×2): 81 mg via ORAL
  Filled 2018-01-12 (×2): qty 1

## 2018-01-12 MED ORDER — DEXAMETHASONE SODIUM PHOSPHATE 10 MG/ML IJ SOLN
10.0000 mg | Freq: Once | INTRAMUSCULAR | Status: AC
  Administered 2018-01-13: 10 mg via INTRAVENOUS
  Filled 2018-01-12: qty 1

## 2018-01-12 MED ORDER — BUPIVACAINE HCL (PF) 0.75 % IJ SOLN
INTRAMUSCULAR | Status: DC | PRN
  Administered 2018-01-12: 25 mL via PERINEURAL

## 2018-01-12 MED ORDER — ACETAMINOPHEN 500 MG PO TABS
1000.0000 mg | ORAL_TABLET | Freq: Once | ORAL | Status: AC
  Administered 2018-01-12: 1000 mg via ORAL
  Filled 2018-01-12: qty 2

## 2018-01-12 MED ORDER — PROPOFOL 10 MG/ML IV BOLUS
INTRAVENOUS | Status: DC | PRN
  Administered 2018-01-12: 20 mg via INTRAVENOUS

## 2018-01-12 MED ORDER — ONDANSETRON HCL 4 MG PO TABS: 4.0000 mg | ORAL_TABLET | Freq: Four times a day (QID) | ORAL | Status: DC | PRN

## 2018-01-12 MED ORDER — FENTANYL CITRATE (PF) 250 MCG/5ML IJ SOLN
INTRAMUSCULAR | Status: DC | PRN
  Administered 2018-01-12 (×2): 50 ug via INTRAVENOUS

## 2018-01-12 MED ORDER — ONDANSETRON HCL 4 MG/2ML IJ SOLN: 4.0000 mg | Freq: Once | INTRAMUSCULAR | Status: DC | PRN

## 2018-01-12 MED ORDER — ONDANSETRON HCL 4 MG/2ML IJ SOLN
INTRAMUSCULAR | Status: DC | PRN
  Administered 2018-01-12: 4 mg via INTRAVENOUS

## 2018-01-12 MED ORDER — GLYCOPYRROLATE PF 0.2 MG/ML IJ SOSY
PREFILLED_SYRINGE | INTRAMUSCULAR | Status: DC | PRN
  Administered 2018-01-12: .1 mg via INTRAVENOUS

## 2018-01-12 MED ORDER — ACETAMINOPHEN 325 MG PO TABS: 325.0000 mg | ORAL_TABLET | Freq: Four times a day (QID) | ORAL | Status: DC | PRN

## 2018-01-12 MED ORDER — MENTHOL 3 MG MT LOZG: 1.0000 | LOZENGE | OROMUCOSAL | Status: DC | PRN

## 2018-01-12 MED ORDER — ACETAMINOPHEN 325 MG PO TABS: 325.0000 mg | ORAL_TABLET | ORAL | Status: DC | PRN

## 2018-01-12 MED ORDER — PHENYLEPHRINE 40 MCG/ML (10ML) SYRINGE FOR IV PUSH (FOR BLOOD PRESSURE SUPPORT)
PREFILLED_SYRINGE | INTRAVENOUS | Status: AC
  Filled 2018-01-12: qty 10

## 2018-01-12 MED ORDER — OXYCODONE HCL 5 MG PO TABS: 5.0000 mg | ORAL_TABLET | Freq: Once | ORAL | Status: DC | PRN

## 2018-01-12 MED ORDER — DEXTROSE 5 % IV SOLN
INTRAVENOUS | Status: DC | PRN
  Administered 2018-01-12: 15 ug/min via INTRAVENOUS

## 2018-01-12 MED ORDER — METOCLOPRAMIDE HCL 5 MG/ML IJ SOLN: 5.0000 mg | Freq: Three times a day (TID) | INTRAMUSCULAR | Status: DC | PRN

## 2018-01-12 MED ORDER — FENTANYL CITRATE (PF) 100 MCG/2ML IJ SOLN: 25.0000 ug | INTRAMUSCULAR | Status: DC | PRN

## 2018-01-12 MED ORDER — CHLORHEXIDINE GLUCONATE 4 % EX LIQD: 60.0000 mL | Freq: Once | CUTANEOUS | Status: DC

## 2018-01-12 MED ORDER — FENTANYL CITRATE (PF) 100 MCG/2ML IJ SOLN
25.0000 ug | INTRAMUSCULAR | Status: DC | PRN
Start: 2018-01-12 — End: 2018-01-12

## 2018-01-12 MED ORDER — MAGNESIUM CITRATE PO SOLN
1.0000 | Freq: Once | ORAL | Status: DC | PRN
Start: 2018-01-12 — End: 2018-01-13

## 2018-01-12 MED ORDER — ONDANSETRON HCL 4 MG/2ML IJ SOLN
INTRAMUSCULAR | Status: AC
  Filled 2018-01-12: qty 2

## 2018-01-12 MED ORDER — METHOCARBAMOL 500 MG PO TABS: 500.0000 mg | ORAL_TABLET | Freq: Four times a day (QID) | ORAL | 0 refills | Status: DC | PRN

## 2018-01-12 MED ORDER — LACTATED RINGERS IV SOLN
INTRAVENOUS | Status: DC
  Administered 2018-01-12: 07:00:00 via INTRAVENOUS

## 2018-01-12 MED ORDER — LIDOCAINE 2% (20 MG/ML) 5 ML SYRINGE
INTRAMUSCULAR | Status: DC | PRN
  Administered 2018-01-12: 20 mg via INTRAVENOUS

## 2018-01-12 MED ORDER — CEFAZOLIN SODIUM-DEXTROSE 2-4 GM/100ML-% IV SOLN
2.0000 g | INTRAVENOUS | Status: AC
  Administered 2018-01-12: 2 g via INTRAVENOUS
  Filled 2018-01-12: qty 100

## 2018-01-12 MED ORDER — PROPOFOL 500 MG/50ML IV EMUL
INTRAVENOUS | Status: DC | PRN
  Administered 2018-01-12: 75 ug/kg/min via INTRAVENOUS

## 2018-01-12 MED ORDER — DOCUSATE SODIUM 100 MG PO CAPS
100.0000 mg | ORAL_CAPSULE | Freq: Two times a day (BID) | ORAL | Status: DC
  Administered 2018-01-12 – 2018-01-13 (×2): 100 mg via ORAL
  Filled 2018-01-12 (×2): qty 1

## 2018-01-12 MED ORDER — KETOROLAC TROMETHAMINE 15 MG/ML IJ SOLN
15.0000 mg | Freq: Four times a day (QID) | INTRAMUSCULAR | Status: AC
  Administered 2018-01-12 – 2018-01-13 (×4): 15 mg via INTRAVENOUS
  Filled 2018-01-12 (×3): qty 1

## 2018-01-12 MED ORDER — SODIUM CHLORIDE 0.9 % IR SOLN
Status: DC | PRN
  Administered 2018-01-12: 3000 mL

## 2018-01-12 MED ORDER — OXYCODONE HCL 5 MG PO TABS
5.0000 mg | ORAL_TABLET | ORAL | Status: DC | PRN
  Administered 2018-01-12: 5 mg via ORAL
  Administered 2018-01-13: 10 mg via ORAL
  Administered 2018-01-13: 5 mg via ORAL
  Filled 2018-01-12 (×2): qty 1
  Filled 2018-01-12: qty 2

## 2018-01-12 MED ORDER — FENTANYL CITRATE (PF) 250 MCG/5ML IJ SOLN
INTRAMUSCULAR | Status: AC
  Filled 2018-01-12: qty 5

## 2018-01-12 MED ORDER — KETOROLAC TROMETHAMINE 15 MG/ML IJ SOLN
INTRAMUSCULAR | Status: AC
  Administered 2018-01-12: 15 mg via INTRAVENOUS
  Filled 2018-01-12: qty 1

## 2018-01-12 MED ORDER — HYDROMORPHONE HCL 2 MG/ML IJ SOLN: 0.5000 mg | INTRAMUSCULAR | Status: DC | PRN

## 2018-01-12 MED ORDER — PHENYLEPHRINE 40 MCG/ML (10ML) SYRINGE FOR IV PUSH (FOR BLOOD PRESSURE SUPPORT)
PREFILLED_SYRINGE | INTRAVENOUS | Status: DC | PRN
  Administered 2018-01-12: 120 ug via INTRAVENOUS
  Administered 2018-01-12: 40 ug via INTRAVENOUS

## 2018-01-12 MED ORDER — CEFAZOLIN SODIUM-DEXTROSE 2-4 GM/100ML-% IV SOLN
2.0000 g | Freq: Four times a day (QID) | INTRAVENOUS | Status: AC
  Administered 2018-01-12 (×2): 2 g via INTRAVENOUS
  Filled 2018-01-12: qty 100

## 2018-01-12 MED ORDER — ASPIRIN EC 81 MG PO TBEC: 81.0000 mg | DELAYED_RELEASE_TABLET | Freq: Two times a day (BID) | ORAL | 0 refills | Status: AC

## 2018-01-12 MED ORDER — ONDANSETRON HCL 4 MG/2ML IJ SOLN
4.0000 mg | Freq: Four times a day (QID) | INTRAMUSCULAR | Status: DC | PRN
Start: 2018-01-12 — End: 2018-01-13

## 2018-01-12 MED ORDER — ACETAMINOPHEN 500 MG PO TABS
1000.0000 mg | ORAL_TABLET | Freq: Three times a day (TID) | ORAL | Status: DC
  Administered 2018-01-12 – 2018-01-13 (×3): 1000 mg via ORAL
  Filled 2018-01-12 (×3): qty 2

## 2018-01-12 MED ORDER — MEPERIDINE HCL 50 MG/ML IJ SOLN: 6.2500 mg | INTRAMUSCULAR | Status: DC | PRN

## 2018-01-12 MED ORDER — DEXTROSE 5 % IV SOLN
500.0000 mg | Freq: Four times a day (QID) | INTRAVENOUS | Status: DC | PRN
  Filled 2018-01-12: qty 5

## 2018-01-12 MED ORDER — 0.9 % SODIUM CHLORIDE (POUR BTL) OPTIME
TOPICAL | Status: DC | PRN
  Administered 2018-01-12: 1000 mL

## 2018-01-12 MED ORDER — POVIDONE-IODINE 10 % EX SWAB: 2.0000 "application " | Freq: Once | CUTANEOUS | Status: DC

## 2018-01-12 MED ORDER — LIDOCAINE 2% (20 MG/ML) 5 ML SYRINGE
INTRAMUSCULAR | Status: AC
  Filled 2018-01-12: qty 5

## 2018-01-12 MED ORDER — ACETAMINOPHEN 500 MG PO TABS: 1000.0000 mg | ORAL_TABLET | Freq: Three times a day (TID) | ORAL | 0 refills | Status: AC

## 2018-01-12 MED ORDER — DIPHENHYDRAMINE HCL 12.5 MG/5ML PO ELIX: 12.5000 mg | ORAL_SOLUTION | ORAL | Status: DC | PRN

## 2018-01-12 MED ORDER — VERAPAMIL HCL ER 120 MG PO TBCR
120.0000 mg | EXTENDED_RELEASE_TABLET | Freq: Every day | ORAL | Status: DC
  Filled 2018-01-12 (×4): qty 1

## 2018-01-12 MED ORDER — SPIRONOLACTONE 25 MG PO TABS
25.0000 mg | ORAL_TABLET | Freq: Every day | ORAL | Status: DC
Start: 2018-01-13 — End: 2018-01-13
  Administered 2018-01-13: 25 mg via ORAL
  Filled 2018-01-12: qty 1

## 2018-01-12 MED ORDER — ONDANSETRON HCL 4 MG PO TABS: 4.0000 mg | ORAL_TABLET | Freq: Three times a day (TID) | ORAL | 0 refills | Status: DC | PRN

## 2018-01-12 MED ORDER — MIDAZOLAM HCL 2 MG/2ML IJ SOLN
INTRAMUSCULAR | Status: AC
  Filled 2018-01-12: qty 2

## 2018-01-12 MED ORDER — MIDAZOLAM HCL 5 MG/5ML IJ SOLN
INTRAMUSCULAR | Status: DC | PRN
  Administered 2018-01-12: 2 mg via INTRAVENOUS

## 2018-01-12 MED ORDER — BUPIVACAINE LIPOSOME 1.3 % IJ SUSP
INTRAMUSCULAR | Status: DC | PRN
  Administered 2018-01-12: 20 mL

## 2018-01-12 MED ORDER — OXYCODONE HCL 5 MG PO TABS: 5.0000 mg | ORAL_TABLET | ORAL | 0 refills | Status: AC | PRN

## 2018-01-12 MED ORDER — METOCLOPRAMIDE HCL 5 MG PO TABS: 5.0000 mg | ORAL_TABLET | Freq: Three times a day (TID) | ORAL | Status: DC | PRN

## 2018-01-12 MED ORDER — LACTATED RINGERS IV SOLN
INTRAVENOUS | Status: DC
  Administered 2018-01-12 – 2018-01-13 (×2): via INTRAVENOUS

## 2018-01-12 MED ORDER — PHENOL 1.4 % MT LIQD: 1.0000 | OROMUCOSAL | Status: DC | PRN

## 2018-01-12 MED ORDER — DEXAMETHASONE SODIUM PHOSPHATE 10 MG/ML IJ SOLN
INTRAMUSCULAR | Status: AC
  Filled 2018-01-12: qty 1

## 2018-01-12 MED ORDER — DOCUSATE SODIUM 100 MG PO CAPS: 100.0000 mg | ORAL_CAPSULE | Freq: Two times a day (BID) | ORAL | 0 refills | Status: DC

## 2018-01-12 MED ORDER — DEXAMETHASONE SODIUM PHOSPHATE 10 MG/ML IJ SOLN
INTRAMUSCULAR | Status: DC | PRN
  Administered 2018-01-12: 10 mg via INTRAVENOUS

## 2018-01-12 MED ORDER — SODIUM CHLORIDE 0.9% FLUSH
INTRAVENOUS | Status: DC | PRN
  Administered 2018-01-12: 30 mL

## 2018-01-12 MED ORDER — MEPERIDINE HCL 50 MG/ML IJ SOLN
6.2500 mg | INTRAMUSCULAR | Status: DC | PRN
Start: 2018-01-12 — End: 2018-01-12

## 2018-01-12 SURGICAL SUPPLY — 56 items
BANDAGE ESMARK 6X9 LF (GAUZE/BANDAGES/DRESSINGS) ×1 IMPLANT
BLADE SAG 18X100X1.27 (BLADE) ×4 IMPLANT
BNDG ELASTIC 6X10 VLCR STRL LF (GAUZE/BANDAGES/DRESSINGS) ×2 IMPLANT
BNDG ELASTIC 6X15 VLCR STRL LF (GAUZE/BANDAGES/DRESSINGS) ×2 IMPLANT
BNDG ESMARK 6X9 LF (GAUZE/BANDAGES/DRESSINGS) ×2
BOWL SMART MIX CTS (DISPOSABLE) ×2 IMPLANT
CLSR STERI-STRIP ANTIMIC 1/2X4 (GAUZE/BANDAGES/DRESSINGS) ×2 IMPLANT
COVER SURGICAL LIGHT HANDLE (MISCELLANEOUS) ×2 IMPLANT
CUFF TOURNIQUET SINGLE 34IN LL (TOURNIQUET CUFF) ×2 IMPLANT
DRAPE EXTREMITY T 121X128X90 (DRAPE) ×2 IMPLANT
DRAPE HALF SHEET 40X57 (DRAPES) ×2 IMPLANT
DRAPE IMP U-DRAPE 54X76 (DRAPES) ×2 IMPLANT
DRAPE U-SHAPE 47X51 STRL (DRAPES) ×2 IMPLANT
DRSG MEPILEX BORDER 4X12 (GAUZE/BANDAGES/DRESSINGS) ×2 IMPLANT
DRSG MEPILEX BORDER 4X8 (GAUZE/BANDAGES/DRESSINGS) ×2 IMPLANT
DURAPREP 26ML APPLICATOR (WOUND CARE) ×4 IMPLANT
ELECT CAUTERY BLADE 6.4 (BLADE) ×2 IMPLANT
ELECT REM PT RETURN 9FT ADLT (ELECTROSURGICAL) ×2
ELECTRODE REM PT RTRN 9FT ADLT (ELECTROSURGICAL) ×1 IMPLANT
FACESHIELD WRAPAROUND (MASK) ×4 IMPLANT
FEMORAL POSTERIOR SZ3 RT (Joint) ×1 IMPLANT
GLOVE BIO SURGEON STRL SZ7.5 (GLOVE) ×4 IMPLANT
GLOVE BIOGEL PI IND STRL 8 (GLOVE) ×2 IMPLANT
GLOVE BIOGEL PI INDICATOR 8 (GLOVE) ×2
GOWN STRL REUS W/ TWL LRG LVL3 (GOWN DISPOSABLE) ×2 IMPLANT
GOWN STRL REUS W/ TWL XL LVL3 (GOWN DISPOSABLE) ×1 IMPLANT
GOWN STRL REUS W/TWL LRG LVL3 (GOWN DISPOSABLE) ×2
GOWN STRL REUS W/TWL XL LVL3 (GOWN DISPOSABLE) ×1
HANDPIECE INTERPULSE COAX TIP (DISPOSABLE)
HOOD PEEL AWAY FACE SHEILD DIS (HOOD) ×2 IMPLANT
IMMOBILIZER KNEE 22 UNIV (SOFTGOODS) ×2 IMPLANT
INSERT TIBIA BEARING PS 3 9MM (Insert) ×2 IMPLANT
KIT BASIN OR (CUSTOM PROCEDURE TRAY) ×2 IMPLANT
KIT TURNOVER KIT B (KITS) ×2 IMPLANT
KNEE PATELLA ASYMMETRIC 9X29 (Knees) ×2 IMPLANT
KNEE TIBIAL COMPONENT SZ3 (Knees) ×2 IMPLANT
MANIFOLD NEPTUNE II (INSTRUMENTS) ×2 IMPLANT
NEEDLE 22X1 1/2 (OR ONLY) (NEEDLE) ×2 IMPLANT
NS IRRIG 1000ML POUR BTL (IV SOLUTION) ×2 IMPLANT
PACK TOTAL JOINT (CUSTOM PROCEDURE TRAY) ×2 IMPLANT
PAD ARMBOARD 7.5X6 YLW CONV (MISCELLANEOUS) ×2 IMPLANT
POSTERIOR FEMORAL SZ3 RT (Joint) ×2 IMPLANT
SET HNDPC FAN SPRY TIP SCT (DISPOSABLE) IMPLANT
SUCTION FRAZIER HANDLE 10FR (MISCELLANEOUS) ×1
SUCTION TUBE FRAZIER 10FR DISP (MISCELLANEOUS) ×1 IMPLANT
SUT MNCRL AB 4-0 PS2 18 (SUTURE) ×2 IMPLANT
SUT MON AB 2-0 CT1 27 (SUTURE) ×2 IMPLANT
SUT VIC AB 0 CT1 27 (SUTURE) ×1
SUT VIC AB 0 CT1 27XBRD ANBCTR (SUTURE) ×1 IMPLANT
SUT VIC AB 1 CT1 27 (SUTURE) ×2
SUT VIC AB 1 CT1 27XBRD ANBCTR (SUTURE) ×2 IMPLANT
SYR 50ML LL SCALE MARK (SYRINGE) ×2 IMPLANT
TOWEL OR 17X24 6PK STRL BLUE (TOWEL DISPOSABLE) ×2 IMPLANT
TOWEL OR 17X26 10 PK STRL BLUE (TOWEL DISPOSABLE) ×2 IMPLANT
TRAY CATH 16FR W/PLASTIC CATH (SET/KITS/TRAYS/PACK) IMPLANT
TRAY FOLEY BAG SILVER LF 14FR (CATHETERS) IMPLANT

## 2018-01-12 NOTE — Op Note (Signed)
DATE OF SURGERY:  01/12/2018 TIME: 9:05 AM  PATIENT NAME:  Autumn Hamilton   AGE: 67 y.o.    PRE-OPERATIVE DIAGNOSIS:  OA RIGHT KNEE  POST-OPERATIVE DIAGNOSIS:  Same  PROCEDURE:  Procedure(s): RIGHT TOTAL KNEE ARTHROPLASTY   SURGEON:  Quintessa Simmerman D, MD   ASSISTANT:  Aquilla HackerHenry Martensen, PA-C, he was present and scrubbed throughout the case, critical for completion in a timely fashion, and for retraction, instrumentation, and closure.    OPERATIVE IMPLANTS: Stryker Triathlon Posterior Stabilized. Press fit knee  Femur size 3, Tibia size 3, Patella size 29 3-peg oval button, with a 9 mm polyethylene insert.   PREOPERATIVE INDICATIONS:  Autumn Hamilton is a 67 y.o. year old female with end stage bone on bone degenerative arthritis of the knee who failed conservative treatment, including injections, antiinflammatories, activity modification, and assistive devices, and had significant impairment of their activities of daily living, and elected for Total Knee Arthroplasty.   The risks, benefits, and alternatives were discussed at length including but not limited to the risks of infection, bleeding, nerve injury, stiffness, blood clots, the need for revision surgery, cardiopulmonary complications, among others, and they were willing to proceed.   OPERATIVE DESCRIPTION:  The patient was brought to the operative room and placed in a supine position.  General anesthesia was administered.  IV antibiotics were given.  The lower extremity was prepped and draped in the usual sterile fashion.  Time out was performed.  The leg was elevated and exsanguinated and the tourniquet was inflated.  Anterior approach was performed.  The patella was everted and osteophytes were removed.  The anterior horn of the medial and lateral meniscus was removed.   The distal femur was opened with the drill and the intramedullary distal femoral cutting jig was utilized, set at 5 degrees resecting 8 mm off the distal  femur.  Care was taken to protect the collateral ligaments.  The distal femoral sizing jig was applied, taking care to avoid notching.  Then the 4-in-1 cutting jig was applied and the anterior and posterior femur was cut, along with the chamfer cuts.  All posterior osteophytes were removed.  The flexion gap was then measured and was symmetric with the extension gap.  Then the extramedullary tibial cutting jig was utilized making the appropriate cut using the anterior tibial crest as a reference building in appropriate posterior slope.  Care was taken during the cut to protect the medial and collateral ligaments.  The proximal tibia was removed along with the posterior horns of the menisci.  The PCL was sacrificed.    The extensor gap was measured and was approximately 9mm.    I completed the distal femoral preparation using the appropriate jig to prepare the box.  The patella was then measured, and cut with the saw.    The proximal tibia sized and prepared accordingly with the reamer and the punch, and then all components were trialed with the above sized poly insert.  The knee was found to have excellent balance and full motion.    The above named components were then impacted into place and Poly tibial piece and patella were inserted.  I was very happy with his stability and ROM  I performed a periarticular injection with marcaine and toradol  The knee was easily taken through a range of motion and the patella tracked well and the knee irrigated copiously and the parapatellar and subcutaneous tissue closed with vicryl, and monocryl with steri strips for the skin.  The incision was dressed with sterile gauze and the tourniquet released and the patient was awakened and returned to the PACU in stable and satisfactory condition.  There were no complications.  Total tourniquet time was roughly 75 minutes.   POSTOPERATIVE PLAN: post op Abx, DVT px: SCD's, TED's, Early ambulation and chemical  px

## 2018-01-12 NOTE — Anesthesia Procedure Notes (Signed)
Anesthesia Regional Block: Adductor canal block   Pre-Anesthetic Checklist: ,, timeout performed, Correct Patient, Correct Site, Correct Laterality, Correct Procedure, Correct Position, site marked, Risks and benefits discussed,  Surgical consent,  Pre-op evaluation,  At surgeon's request and post-op pain management  Laterality: Right  Prep: chloraprep       Needles:  Injection technique: Single-shot  Needle Type: Echogenic Stimulator Needle     Needle Length: 5cm  Needle Gauge: 22     Additional Needles:   Procedures:, nerve stimulator,,, ultrasound used (permanent image in chart),,,,  Narrative:  Start time: 01/12/2018 7:13 AM End time: 01/12/2018 7:17 AM Injection made incrementally with aspirations every 5 mL.  Performed by: Personally  Anesthesiologist: Bethena Midgetddono, Mohd. Derflinger, MD  Additional Notes: Functioning IV was confirmed and monitors were applied.  A 50mm 22ga Arrow echogenic stimulator needle was used. Sterile prep and drape,hand hygiene and sterile gloves were used. Ultrasound guidance: relevant anatomy identified, needle position confirmed, local anesthetic spread visualized around nerve(s)., vascular puncture avoided.  Image printed for medical record. Negative aspiration and negative test dose prior to incremental administration of local anesthetic. The patient tolerated the procedure well.

## 2018-01-12 NOTE — Evaluation (Signed)
Physical Therapy Evaluation Patient Details Name: Gasper SellsMonica V Hijazi MRN: [id number] DOB: Dec 27, 1950 Today's Date: 01/12/2018   History of Present Illness  Pt is a 67 y/o female s/p elective R TKA. PMH includes HTN and L TKA.   Clinical Impression  Pt is s/p surgery above with deficits below. Pt with R knee buckling even with use of KI, therefore distance limited to within the room. Pt with one LOB during gait to bathroom and required min to mod A for steadying. Educated about knee precautions and supine HEP. Will continue to follow acutely to maximize functional mobility independence and safety.     Follow Up Recommendations Follow surgeon's recommendation for DC plan and follow-up therapies;Supervision for mobility/OOB    Equipment Recommendations  None recommended by PT    Recommendations for Other Services       Precautions / Restrictions Precautions Precautions: Knee Precaution Booklet Issued: Yes (comment) Precaution Comments: Reviewed knee precautions and supine HEP with pt.  Restrictions Weight Bearing Restrictions: Yes RLE Weight Bearing: Weight bearing as tolerated      Mobility  Bed Mobility Overal bed mobility: Needs Assistance Bed Mobility: Supine to Sit     Supine to sit: Supervision     General bed mobility comments: Supervision for safety. Increased time required.   Transfers Overall transfer level: Needs assistance Equipment used: Rolling walker (2 wheeled) Transfers: Sit to/from Stand Sit to Stand: Min assist         General transfer comment: Min A for steadying assist. Pt slightly impulsive and pulling up on RW, therefore PT required to brace.   Ambulation/Gait Ambulation/Gait assistance: Min assist;Min guard Ambulation Distance (Feet): 15 Feet Assistive device: Rolling walker (2 wheeled) Gait Pattern/deviations: Step-to pattern;Decreased step length - right;Decreased step length - left;Decreased weight shift to right;Antalgic Gait velocity:  Decreased    General Gait Details: Slow, antalgic gait. R knee buckling noted even with use of KI, therefore distance limited to bathroom and back to bed. Pt with 1 LOB requiring min to mod A for steadying. Verbal cues for sequencing using RW.   Stairs            Wheelchair Mobility    Modified Rankin (Stroke Patients Only)       Balance Overall balance assessment: Needs assistance Sitting-balance support: No upper extremity supported;Feet supported Sitting balance-Leahy Scale: Good     Standing balance support: Bilateral upper extremity supported;During functional activity Standing balance-Leahy Scale: Poor Standing balance comment: Reliant on BUE support.                              Pertinent Vitals/Pain Pain Assessment: Faces Faces Pain Scale: Hurts little more Pain Location: R knee  Pain Descriptors / Indicators: Aching;Operative site guarding Pain Intervention(s): Limited activity within patient's tolerance;Monitored during session;Repositioned    Home Living Family/patient expects to be discharged to:: Private residence Living Arrangements: Spouse/significant other Available Help at Discharge: Family;Available 24 hours/day Type of Home: House Home Access: Stairs to enter Entrance Stairs-Rails: Right;Left;Can reach both Entrance Stairs-Number of Steps: 6 Home Layout: Multi-level;Able to live on main level with bedroom/bathroom Home Equipment: Dan HumphreysWalker - 2 wheels;Shower seat - built in;Bedside commode      Prior Function Level of Independence: Independent               Hand Dominance        Extremity/Trunk Assessment   Upper Extremity Assessment Upper Extremity Assessment: Defer to OT evaluation  Lower Extremity Assessment Lower Extremity Assessment: RLE deficits/detail RLE Deficits / Details: Reports some numbness. Deficits consistent with post op pain and weakness. Able to perform ther ex below.     Cervical / Trunk  Assessment Cervical / Trunk Assessment: Normal  Communication   Communication: No difficulties  Cognition Arousal/Alertness: Awake/alert Behavior During Therapy: WFL for tasks assessed/performed Overall Cognitive Status: Within Functional Limits for tasks assessed                                        General Comments General comments (skin integrity, edema, etc.): Pt's husband and friend present during session.     Exercises Total Joint Exercises Ankle Circles/Pumps: AROM;Both;20 reps Quad Sets: AROM;Right;10 reps Heel Slides: AROM;Right;10 reps   Assessment/Plan    PT Assessment Patient needs continued PT services  PT Problem List Decreased strength;Decreased balance;Decreased mobility;Decreased knowledge of use of DME;Decreased knowledge of precautions;Pain       PT Treatment Interventions DME instruction;Gait training;Stair training;Functional mobility training;Therapeutic activities;Therapeutic exercise;Balance training;Patient/family education    PT Goals (Current goals can be found in the Care Plan section)  Acute Rehab PT Goals Patient Stated Goal: to go home  PT Goal Formulation: With patient Time For Goal Achievement: 01/26/18 Potential to Achieve Goals: Good    Frequency 7X/week   Barriers to discharge        Co-evaluation               AM-PAC PT "6 Clicks" Daily Activity  Outcome Measure Difficulty turning over in bed (including adjusting bedclothes, sheets and blankets)?: A Little Difficulty moving from lying on back to sitting on the side of the bed? : A Little Difficulty sitting down on and standing up from a chair with arms (e.g., wheelchair, bedside commode, etc,.)?: Unable Help needed moving to and from a bed to chair (including a wheelchair)?: A Little Help needed walking in hospital room?: A Little Help needed climbing 3-5 steps with a railing? : A Lot 6 Click Score: 15    End of Session Equipment Utilized During  Treatment: Gait belt;Right knee immobilizer Activity Tolerance: Patient tolerated treatment well Patient left: in bed;with call bell/phone within reach;with family/visitor present(sitting EOB ) Nurse Communication: Mobility status PT Visit Diagnosis: Other abnormalities of gait and mobility (R26.89);Unsteadiness on feet (R26.81);Muscle weakness (generalized) (M62.81);Pain Pain - Right/Left: Right Pain - part of body: Knee    Time: 1610-9604 PT Time Calculation (min) (ACUTE ONLY): 28 min   Charges:   PT Evaluation $PT Eval Low Complexity: 1 Low PT Treatments $Therapeutic Activity: 8-22 mins   PT G Codes:        Gladys Damme, PT, DPT  Acute Rehabilitation Services  Pager: [phone]   Lehman Prom 01/12/2018, 6:22 PM

## 2018-01-12 NOTE — Interval H&P Note (Signed)
History and Physical Interval Note:  01/12/2018 7:12 AM  [name]  has presented today for surgery, with the diagnosis of OA RIGHT KNEE  The various methods of treatment have been discussed with the patient and family. After consideration of risks, benefits and other options for treatment, the patient has consented to  Procedure(s): RIGHT TOTAL KNEE ARTHROPLASTY (Right) as a surgical intervention .  The patient's history has been reviewed, patient examined, no change in status, stable for surgery.  I have reviewed the patient's chart and labs.  Questions were answered to the patient's satisfaction.     Tamar Miano D

## 2018-01-12 NOTE — Progress Notes (Signed)
Care of pt assumed by MA Erie Radu RN 

## 2018-01-12 NOTE — Transfer of Care (Signed)
Immediate Anesthesia Transfer of Care Note  Patient: Autumn Hamilton  Procedure(s) Performed: RIGHT TOTAL KNEE ARTHROPLASTY (Right Knee)  Patient Location: PACU  Anesthesia Type:Spinal and MAC combined with regional for post-op pain  Level of Consciousness: drowsy and patient cooperative  Airway & Oxygen Therapy: Patient Spontanous Breathing and Patient connected to face mask oxygen  Post-op Assessment: Report given to RN and Post -op Vital signs reviewed and stable  Post vital signs: Reviewed and stable  Last Vitals:  Vitals Value Taken Time  BP    Temp    Pulse 99 01/12/2018  9:52 AM  Resp 15 01/12/2018  9:52 AM  SpO2 100 % 01/12/2018  9:52 AM  Vitals shown include unvalidated device data.  Last Pain:  Vitals:   01/12/18 0558  TempSrc:   PainSc: 0-No pain      Patients Stated Pain Goal: 3 (40/98/11 9147)  Complications: No apparent anesthesia complications

## 2018-01-12 NOTE — Anesthesia Procedure Notes (Signed)
Spinal  Patient location during procedure: OR Start time: 01/12/2018 7:30 AM End time: 01/12/2018 7:35 AM Staffing Anesthesiologist: Bethena Midgetddono, Seairra Otani, MD Preanesthetic Checklist Completed: patient identified, site marked, surgical consent, pre-op evaluation, timeout performed, IV checked, risks and benefits discussed and monitors and equipment checked Spinal Block Patient position: sitting Prep: DuraPrep Patient monitoring: heart rate, cardiac monitor, continuous pulse ox and blood pressure Approach: midline Location: L4-5 Injection technique: single-shot Needle Needle type: Sprotte  Needle gauge: 24 G Needle length: 9 cm Assessment Sensory level: T4

## 2018-01-12 NOTE — Discharge Instructions (Signed)
You may bear weight as tolerated. °Keep your dressing on and dry until follow up. °Take medicine to prevent blood clots as directed. °Take pain medicine as needed with the goal of transitioning to over the counter medicines.  °If needed, you may increase pain medication for the first few days post up to 2 tablets every 4 hours. ° °INSTRUCTIONS AFTER JOINT REPLACEMENT  ° °o Remove items at home which could result in a fall. This includes throw rugs or furniture in walking pathways °o ICE to the affected joint every three hours while awake for 30 minutes at a time, for at least the first 3-5 days, and then as needed for pain and swelling.  Continue to use ice for pain and swelling. You may notice swelling that will progress down to the foot and ankle.  This is normal after surgery.  Elevate your leg when you are not up walking on it.   °o Continue to use the breathing machine you got in the hospital (incentive spirometer) which will help keep your temperature down.  It is common for your temperature to cycle up and down following surgery, especially at night when you are not up moving around and exerting yourself.  The breathing machine keeps your lungs expanded and your temperature down. ° ° °DIET:  As you were doing prior to hospitalization, we recommend a well-balanced diet. ° °DRESSING / WOUND CARE / SHOWERING ° °You may shower 3 days after surgery, but keep the wounds dry during showering.  You may use an occlusive plastic wrap (Press'n Seal for example) with blue painter's tape at edges, NO SOAKING/SUBMERGING IN THE BATHTUB.  If the bandage gets wet, change with a clean dry gauze.  If the incision gets wet, pat the wound dry with a clean towel. ° °ACTIVITY ° °o Increase activity slowly as tolerated, but follow the weight bearing instructions below.   °o No driving for 6 weeks or until further direction given by your physician.  You cannot drive while taking narcotics.  °o No lifting or carrying greater than 10  lbs. until further directed by your surgeon. °o Avoid periods of inactivity such as sitting longer than an hour when not asleep. This helps prevent blood clots.  °o You may return to work once you are authorized by your doctor.  ° ° ° °WEIGHT BEARING  ° °Weight bearing as tolerated with assist device (walker, cane, etc) as directed, use it as long as suggested by your surgeon or therapist, typically at least 4-6 weeks. ° ° °EXERCISES ° °Results after joint replacement surgery are often greatly improved when you follow the exercise, range of motion and muscle strengthening exercises prescribed by your doctor. Safety measures are also important to protect the joint from further injury. Any time any of these exercises cause you to have increased pain or swelling, decrease what you are doing until you are comfortable again and then slowly increase them. If you have problems or questions, call your caregiver or physical therapist for advice.  ° °Rehabilitation is important following a joint replacement. After just a few days of immobilization, the muscles of the leg can become weakened and shrink (atrophy).  These exercises are designed to build up the tone and strength of the thigh and leg muscles and to improve motion. Often times heat used for twenty to thirty minutes before working out will loosen up your tissues and help with improving the range of motion but do not use heat for the first two   weeks following surgery (sometimes heat can increase post-operative swelling).  ° °These exercises can be done on a training (exercise) mat, on the floor, on a table or on a bed. Use whatever works the best and is most comfortable for you.    Use music or television while you are exercising so that the exercises are a pleasant break in your day. This will make your life better with the exercises acting as a break in your routine that you can look forward to.   Perform all exercises about fifteen times, three times per day or as  directed.  You should exercise both the operative leg and the other leg as well. ° °Exercises include: °  °• Quad Sets - Tighten up the muscle on the front of the thigh (Quad) and hold for 5-10 seconds.   °• Straight Leg Raises - With your knee straight (if you were given a brace, keep it on), lift the leg to 60 degrees, hold for 3 seconds, and slowly lower the leg.  Perform this exercise against resistance later as your leg gets stronger.  °• Leg Slides: Lying on your back, slowly slide your foot toward your buttocks, bending your knee up off the floor (only go as far as is comfortable). Then slowly slide your foot back down until your leg is flat on the floor again.  °• Angel Wings: Lying on your back spread your legs to the side as far apart as you can without causing discomfort.  °• Hamstring Strength:  Lying on your back, push your heel against the floor with your leg straight by tightening up the muscles of your buttocks.  Repeat, but this time bend your knee to a comfortable angle, and push your heel against the floor.  You may put a pillow under the heel to make it more comfortable if necessary.  ° °A rehabilitation program following joint replacement surgery can speed recovery and prevent re-injury in the future due to weakened muscles. Contact your doctor or a physical therapist for more information on knee rehabilitation.  ° ° °CONSTIPATION ° °Constipation is defined medically as fewer than three stools per week and severe constipation as less than one stool per week.  Even if you have a regular bowel pattern at home, your normal regimen is likely to be disrupted due to multiple reasons following surgery.  Combination of anesthesia, postoperative narcotics, change in appetite and fluid intake all can affect your bowels.  ° °YOU MUST use at least one of the following options; they are listed in order of increasing strength to get the job done.  They are all available over the counter, and you may need to  use some, POSSIBLY even all of these options:   ° °Drink plenty of fluids (prune juice may be helpful) and high fiber foods °Colace 100 mg by mouth twice a day  °Senokot for constipation as directed and as needed Dulcolax (bisacodyl), take with full glass of water  °Miralax (polyethylene glycol) once or twice a day as needed. ° °If you have tried all these things and are unable to have a bowel movement in the first 3-4 days after surgery call either your surgeon or your primary doctor.   ° °If you experience loose stools or diarrhea, hold the medications until you stool forms back up.  If your symptoms do not get better within 1 week or if they get worse, check with your doctor.  If you experience "the worst abdominal pain ever" or   develop nausea or vomiting, please contact the office immediately for further recommendations for treatment. ° ° °ITCHING:  If you experience itching with your medications, try taking only a single pain pill, or even half a pain pill at a time.  You can also use Benadryl over the counter for itching or also to help with sleep.  ° °TED HOSE STOCKINGS:  Use stockings on both legs until for at least 2 weeks or as directed by physician office. They may be removed at night for sleeping. ° °MEDICATIONS:  See your medication summary on the “After Visit Summary” that nursing will review with you.  You may have some home medications which will be placed on hold until you complete the course of blood thinner medication.  It is important for you to complete the blood thinner medication as prescribed. ° °PRECAUTIONS:  If you experience chest pain or shortness of breath - call 911 immediately for transfer to the hospital emergency department.  ° °If you develop a fever greater that 101 F, purulent drainage from wound, increased redness or drainage from wound, foul odor from the wound/dressing, or calf pain - CONTACT YOUR SURGEON.   °                                                °FOLLOW-UP  APPOINTMENTS:  If you do not already have a post-op appointment, please call the office for an appointment to be seen by your surgeon.  Guidelines for how soon to be seen are listed in your “After Visit Summary”, but are typically between 1-4 weeks after surgery. ° °OTHER INSTRUCTIONS:  ° ° ° °MAKE SURE YOU:  °• Understand these instructions.  °• Get help right away if you are not doing well or get worse.  ° ° °Thank you for letting us be a part of your medical care team.  It is a privilege we respect greatly.  We hope these instructions will help you stay on track for a fast and full recovery!  ° ° ° ° °

## 2018-01-12 NOTE — Progress Notes (Signed)
Orthopedic Tech Progress Note Patient Details:  Gasper SellsMonica V Hrdlicka 01-21-1951 [id number]  CPM Right Knee CPM Right Knee: On Right Knee Flexion (Degrees): 90 Right Knee Extension (Degrees): 0 Additional Comments: trapeze bar patient helper  Post Interventions Patient Tolerated: Well Instructions Provided: Care of device  Nikki DomCrawford, Ramces Shomaker 01/12/2018, 10:42 AM Viewed order from doctor's order list

## 2018-01-13 ENCOUNTER — Encounter (HOSPITAL_COMMUNITY): Payer: Self-pay | Admitting: Orthopedic Surgery

## 2018-01-13 DIAGNOSIS — M1711 Unilateral primary osteoarthritis, right knee: Secondary | ICD-10-CM | POA: Diagnosis not present

## 2018-01-13 NOTE — Plan of Care (Signed)
  Problem: Pain Managment: Goal: General experience of comfort will improve Outcome: Progressing   

## 2018-01-13 NOTE — Progress Notes (Signed)
Orthopedic Tech Progress Note Patient Details:  Autumn Hamilton 1951/04/17 [id number]  CPM Right Knee CPM Right Knee: On Right Knee Flexion (Degrees): 90 Right Knee Extension (Degrees): 0 Additional Comments: bone foam applied.    Post Interventions Patient Tolerated: Well Instructions Provided: Care of device  Saul FordyceJennifer C Morgan Rennert 01/13/2018, 1:12 PM

## 2018-01-13 NOTE — Progress Notes (Signed)
    Subjective: Patient reports pain as mild.  Tolerating diet.  Urinating.  No CP, SOB.   OOB in room.  Objective:   VITALS:   Vitals:   01/12/18 1545 01/12/18 1609 01/12/18 2147 01/13/18 0604  BP:  124/81 118/85 102/60  Pulse:  81 94 73  Resp: 18  19 19   Temp:  98.3 F (36.8 C) 97.6 F (36.4 C) 98.3 F (36.8 C)  TempSrc:  Oral Oral Oral  SpO2:  100% 98% 100%  Weight:      Height:       CBC Latest Ref Rng & Units 12/30/2017 08/14/2017  WBC 4.0 - 10.5 K/uL 5.7 5.4  Hemoglobin 12.0 - 15.0 g/dL 11.9(L) 12.3  Hematocrit 36.0 - 46.0 % 37.5 37.9  Platelets 150 - 400 K/uL 275 272   BMP Latest Ref Rng & Units 12/30/2017 08/14/2017  Glucose 65 - 99 mg/dL 96 865(H140(H)  BUN 6 - 20 mg/dL 12 7  Creatinine 8.460.44 - 1.00 mg/dL 9.620.73 9.520.77  Sodium 841135 - 145 mmol/L 142 139  Potassium 3.5 - 5.1 mmol/L 3.7 3.3(L)  Chloride 101 - 111 mmol/L 107 102  CO2 22 - 32 mmol/L 25 27  Calcium 8.9 - 10.3 mg/dL 9.6 9.4   Intake/Output      06/04 0701 - 06/05 0700 06/05 0701 - 06/06 0700   P.O. 150    I.V. (mL/kg) 1200 (14.3)    Total Intake(mL/kg) 1350 (16.1)    Blood 20    Total Output 20    Net +1330         Urine Occurrence 2 x       Physical Exam: General: NAD.  Supine in bed.  Calm and conversant. Resp: No increased wob Cardio: regular rate and rhythm ABD soft Neurologically intact MSK Neurovascularly intact Sensation intact distally Intact pulses distally Dorsiflexion/Plantar flexion intact Incision: dressing C/D/I   Assessment: 1 Day Post-Op  S/P Procedure(s) (LRB): RIGHT TOTAL KNEE ARTHROPLASTY (Right) by Dr. Jewel Baizeimothy D. Murphy on 01/12/2018  Principal Problem:   Primary osteoarthritis of right knee Active Problems:   Essential hypertension   Primary osteoarthritis of knee   Primary osteoarthritis, status post right total knee arthroplasty Doing well Pain controlled Eating, drinking, and voiding Mobilizing well  Plan: Advance diet Up with therapy D/C IV  fluids Incentive Spirometry Elevate and Apply ice CPM, bone foam  Weight Bearing: Weight Bearing as Tolerated (WBAT) RLE Dressings: Maintain Mepilex.  Please apply thigh high TED hose to operative leg prior to discharge. VTE prophylaxis: Aspirin, SCDs, ambulation Dispo: Home today after therapy    Patient's anticipated LOS is less than 2 midnights, meeting these requirements: - Younger than 7365 - Lives within 1 hour of care - Has a competent adult at home to recover with post-op recover - NO history of  - Chronic pain requiring opiods  - Diabetes  - Coronary Artery Disease  - Heart failure  - Heart attack  - Stroke  - DVT/VTE  - Cardiac arrhythmia  - Respiratory Failure/COPD  - Renal failure  - Anemia  - Advanced Liver disease    Autumn BilletHenry Calvin Martensen III, PA-C 01/13/2018, 7:34 AM

## 2018-01-13 NOTE — Discharge Summary (Signed)
Discharge Summary  Patient ID: Autumn Hamilton MRN: [id number] DOB/AGE: [birth year] 67 y.o.  Admit date: 01/12/2018 Discharge date: 01/13/2018  Admission Diagnoses:  Primary osteoarthritis of right knee  Discharge Diagnoses:  Principal Problem:   Primary osteoarthritis of right knee Active Problems:   Essential hypertension   Primary osteoarthritis of knee   Past Medical History:  Diagnosis Date  . Arthritis   . Hypertension   . Primary localized osteoarthritis of knee    Right    Surgeries: Procedure(s): RIGHT TOTAL KNEE ARTHROPLASTY on 01/12/2018   Consultants (if any):   Discharged Condition: Improved  Hospital Course: Autumn Hamilton is an 67 y.o. female who was admitted 01/12/2018 with a diagnosis of Primary osteoarthritis of right knee and went to the operating room on 01/12/2018 and underwent the above named procedures.    She was given perioperative antibiotics:  Anti-infectives (From admission, onward)   Start     Dose/Rate Route Frequency Ordered Stop   01/12/18 1400  ceFAZolin (ANCEF) IVPB 2g/100 mL premix     2 g 200 mL/hr over 30 Minutes Intravenous Every 6 hours 01/12/18 1349 01/12/18 2030   01/12/18 0600  ceFAZolin (ANCEF) IVPB 2g/100 mL premix     2 g 200 mL/hr over 30 Minutes Intravenous On call to O.R. 01/12/18 0510 01/12/18 0740    .  She was given sequential compression devices, early ambulation, and aspirin for DVT prophylaxis.  She benefited maximally from the hospital stay and there were no complications.    Recent vital signs:  Vitals:   01/12/18 2147 01/13/18 0604  BP: 118/85 102/60  Pulse: 94 73  Resp: 19 19  Temp: 97.6 F (36.4 C) 98.3 F (36.8 C)  SpO2: 98% 100%    Recent laboratory studies:  Lab Results  Component Value Date   HGB 11.9 (L) 12/30/2017   HGB 12.3 08/14/2017   Lab Results  Component Value Date   WBC 5.7 12/30/2017   PLT 275 12/30/2017   No results found for: INR Lab Results  Component Value Date   NA 142  12/30/2017   K 3.7 12/30/2017   CL 107 12/30/2017   CO2 25 12/30/2017   BUN 12 12/30/2017   CREATININE 0.73 12/30/2017   GLUCOSE 96 12/30/2017    Discharge Medications:   Allergies as of 01/13/2018   No Known Allergies     Medication List    TAKE these medications   acetaminophen 500 MG tablet Commonly known as:  TYLENOL Take 2 tablets (1,000 mg total) by mouth every 8 (eight) hours for 14 days. For Pain. What changed:    when to take this  reasons to take this  additional instructions   aspirin EC 81 MG tablet Take 1 tablet (81 mg total) by mouth 2 (two) times daily. For DVT prophylaxis What changed:    when to take this  additional instructions   Biotin 5000 MCG Tabs Take 1 tablet by mouth daily.   cholecalciferol 1000 units tablet Commonly known as:  VITAMIN D Take 1,000 Units by mouth daily.   CITRUCEL 500 MG Tabs Generic drug:  Methylcellulose (Laxative) Take 1,000 mg by mouth daily.   docusate sodium 100 MG capsule Commonly known as:  COLACE Take 1 capsule (100 mg total) by mouth 2 (two) times daily. To prevent constipation while taking pain medication.   DUEXIS 800-26.6 MG Tabs Generic drug:  Ibuprofen-Famotidine Take 1 tablet by mouth 3 (three) times daily as needed (for pain.).   eplerenone  50 MG tablet Commonly known as:  INSPRA Take 100 mg by mouth daily.   KLS ALLERCLEAR 10 MG tablet Generic drug:  loratadine Take 10 mg by mouth daily.   methocarbamol 500 MG tablet Commonly known as:  ROBAXIN Take 1 tablet (500 mg total) by mouth every 6 (six) hours as needed for muscle spasms.   MOVE FREE PO Take 1 tablet by mouth daily.   multivitamin with minerals tablet Take 1 tablet by mouth daily.   ondansetron 4 MG tablet Commonly known as:  ZOFRAN Take 1 tablet (4 mg total) by mouth every 8 (eight) hours as needed for nausea or vomiting.   oxyCODONE 5 MG immediate release tablet Commonly known as:  ROXICODONE Take 1 tablet (5 mg total)  by mouth every 4 (four) hours as needed for breakthrough pain.   verapamil 120 MG CR tablet Commonly known as:  CALAN-SR Take 120 mg by mouth daily.       Diagnostic Studies: Dg Knee Right Port  Result Date: 01/12/2018 CLINICAL DATA:  Total right knee replacement EXAM: PORTABLE RIGHT KNEE - 1-2 VIEW COMPARISON:  None. FINDINGS: Changes of right knee replacement. Soft tissue and joint space gas noted. No hardware or bony complicating feature. IMPRESSION: Right knee replacement.  No complicating feature. Electronically Signed   By: Charlett Nose M.D.   On: 01/12/2018 10:32    Disposition: Discharge disposition: 01-Home or Self Care       Discharge Instructions    Discharge patient   Complete by:  As directed    After therapy   Discharge disposition:  01-Home or Self Care   Discharge patient date:  01/13/2018      Follow-up Information    Sheral Apley, MD.   Specialty:  Orthopedic Surgery Contact information: [address] [place] [address] [phone]            Signed: Albina Billet III PA-C 01/13/2018, 7:36 AM

## 2018-01-13 NOTE — Anesthesia Postprocedure Evaluation (Signed)
Anesthesia Post Note  Patient: Gasper SellsMonica V Delira  Procedure(s) Performed: RIGHT TOTAL KNEE ARTHROPLASTY (Right Knee)     Patient location during evaluation: PACU Anesthesia Type: Spinal Level of consciousness: oriented and awake and alert Pain management: pain level controlled Vital Signs Assessment: post-procedure vital signs reviewed and stable Respiratory status: spontaneous breathing, respiratory function stable and patient connected to nasal cannula oxygen Cardiovascular status: blood pressure returned to baseline and stable Postop Assessment: no headache, no backache and no apparent nausea or vomiting Anesthetic complications: no    Last Vitals:  Vitals:   01/12/18 2147 01/13/18 0604  BP: 118/85 102/60  Pulse: 94 73  Resp: 19 19  Temp: 36.4 C 36.8 C  SpO2: 98% 100%    Last Pain:  Vitals:   01/13/18 1034  TempSrc:   PainSc: 0-No pain   Pain Goal: Patients Stated Pain Goal: 3 (01/12/18 0558)               Antonisha Waskey

## 2018-01-13 NOTE — Evaluation (Signed)
Occupational Therapy Evaluation Patient Details Name: Autumn Hamilton MRN: [id number] DOB: 1950/12/18 Today's Date: 01/13/2018    History of Present Illness Pt is a 67 y/o female s/p elective R TKA. PMH includes HTN and L TKA.    Clinical Impression   Pt at min A level with LB ADLs and sup with ADL mobility using RW. Pt will have assist at home from spouse and family. Pt has all necessary DME and A/E at home from previous knee surgery. All education completed and no further acute OT is indicated at this time    Follow Up Recommendations  No OT follow up;Supervision - Intermittent    Equipment Recommendations  None recommended by OT    Recommendations for Other Services       Precautions / Restrictions Precautions Precautions: Knee Precaution Booklet Issued: Yes (comment) Precaution Comments: Reviewed knee precautions and no pillow under knee Restrictions Weight Bearing Restrictions: Yes RLE Weight Bearing: Weight bearing as tolerated      Mobility Bed Mobility Overal bed mobility: Needs Assistance Bed Mobility: Supine to Sit     Supine to sit: Supervision     General bed mobility comments: pt up in recliner upon arrival  Transfers Overall transfer level: Needs assistance Equipment used: Rolling walker (2 wheeled) Transfers: Sit to/from Stand Sit to Stand: Supervision         General transfer comment: Cues for hand placement to push from seated surface.  Pt slightly impulsive and remains to reach for RW initially.Pt able to correct with cueing.      Balance Overall balance assessment: Needs assistance Sitting-balance support: No upper extremity supported;Feet supported Sitting balance-Leahy Scale: Good     Standing balance support: Bilateral upper extremity supported;During functional activity Standing balance-Leahy Scale: Poor Standing balance comment: Reliant on BUE support.                            ADL either performed or assessed with  clinical judgement   ADL Overall ADL's : Needs assistance/impaired Eating/Feeding: Independent;Sitting   Grooming: Wash/dry hands;Wash/dry face;Supervision/safety;Set up;Standing   Upper Body Bathing: Set up;Sitting   Lower Body Bathing: Minimal assistance;With caregiver independent assisting   Upper Body Dressing : Set up;Sitting   Lower Body Dressing: Minimal assistance;With caregiver independent assisting   Toilet Transfer: Supervision/safety;Ambulation;RW;Comfort height toilet   Toileting- Clothing Manipulation and Hygiene: Min guard;Sit to/from stand   Tub/ Shower Transfer: Supervision/safety;Ambulation;Rolling walker;3 in 1   Functional mobility during ADLs: Supervision/safety General ADL Comments: pt has all necessary DME and A/E at home from previous knee surgery     Vision Patient Visual Report: No change from baseline       Perception     Praxis      Pertinent Vitals/Pain Pain Assessment: 0-10 Pain Score: 5  Faces Pain Scale: Hurts even more Pain Location: R knee  Pain Descriptors / Indicators: Aching;Operative site guarding Pain Intervention(s): Monitored during session;Repositioned     Hand Dominance Right   Extremity/Trunk Assessment Upper Extremity Assessment Upper Extremity Assessment: Overall WFL for tasks assessed   Lower Extremity Assessment Lower Extremity Assessment: Defer to PT evaluation   Cervical / Trunk Assessment Cervical / Trunk Assessment: Normal   Communication Communication Communication: No difficulties   Cognition Arousal/Alertness: Awake/alert Behavior During Therapy: WFL for tasks assessed/performed Overall Cognitive Status: Within Functional Limits for tasks assessed  General Comments       Exercises Total Joint Exercises Ankle Circles/Pumps: AROM;Both;20 reps Quad Sets: AROM;Right;10 reps Towel Squeeze: AROM;Both;10 reps;Supine Short Arc Quad: AROM;Right;10  reps;Supine Heel Slides: AROM;Right;10 reps;Supine Hip ABduction/ADduction: AROM;Right;10 reps;Supine Straight Leg Raises: AROM;Right;10 reps;Supine Long Arc Quad: AROM;Right;10 reps;Seated Knee Flexion: AROM;Right;10 reps;AAROM;Seated(1x10 AROM and 1x10 AAROM.  ) Goniometric ROM: 85 degrees flexion in R knee.     Shoulder Instructions      Home Living Family/patient expects to be discharged to:: Private residence Living Arrangements: Spouse/significant other Available Help at Discharge: Family;Available 24 hours/day Type of Home: House Home Access: Stairs to enter [employer] of Steps: 6 Entrance Stairs-Rails: Right;Left;Can reach both Home Layout: Multi-level;Able to live on main level with bedroom/bathroom     Bathroom Shower/Tub: Producer, television/film/video: Standard Bathroom Accessibility: Yes   Home Equipment: Environmental consultant - 2 wheels;Shower seat - built in;Bedside commode          Prior Functioning/Environment Level of Independence: Independent        Comments: Engineer, structural, driver        OT Problem List: Decreased activity tolerance;Impaired balance (sitting and/or standing);Pain      OT Treatment/Interventions:      OT Goals(Current goals can be found in the care plan section) Acute Rehab OT Goals Patient Stated Goal: to go home  OT Goal Formulation: With patient/family  OT Frequency:     Barriers to D/C:    no barriers       Co-evaluation              AM-PAC PT "6 Clicks" Daily Activity     Outcome Measure Help from another person eating meals?: None Help from another person taking care of personal grooming?: None Help from another person toileting, which includes using toliet, bedpan, or urinal?: A Little Help from another person bathing (including washing, rinsing, drying)?: A Little Help from another person to put on and taking off regular upper body clothing?: None Help from another person to put on and taking off regular  lower body clothing?: A Little 6 Click Score: 21   End of Session Equipment Utilized During Treatment: Rolling walker CPM Right Knee CPM Right Knee: Off Additional Comments: bone foam applied.    Activity Tolerance: Patient tolerated treatment well Patient left: in bed;with call bell/phone within reach;with family/visitor present  OT Visit Diagnosis: Other abnormalities of gait and mobility (R26.89);Pain Pain - Right/Left: Right Pain - part of body: Knee                Time: 6962-9528 OT Time Calculation (min): 29 min Charges:  OT General Charges $OT Visit: 1 Visit OT Evaluation $OT Eval Low Complexity: 1 Low OT Treatments $Therapeutic Activity: 8-22 mins G-Codes: OT G-codes **NOT FOR INPATIENT CLASS** Functional Assessment Tool Used: AM-PAC 6 Clicks Daily Activity     Galen Manila 01/13/2018, 1:42 PM

## 2018-01-13 NOTE — Progress Notes (Signed)
Physical Therapy Treatment Patient Details Name: Gasper SellsMonica V Troop MRN: [id number] DOB: 09-25-1950 Today's Date: 01/13/2018    History of Present Illness Pt is a 67 y/o female s/p elective R TKA. PMH includes HTN and L TKA.     PT Comments    Pt performed increased activity with progression of mobility, gait and through her HEP.  Pt remains to buckle in R stance phase but able to self correct with upper extremity strength.  Pt tolerated session well.  Reviewed HEP in its entirety and educated on frequency.  Pt resting in recliner in her bone foam.   Follow Up Recommendations  Follow surgeon's recommendation for DC plan and follow-up therapies;Supervision for mobility/OOB     Equipment Recommendations  None recommended by PT    Recommendations for Other Services       Precautions / Restrictions Precautions Precautions: Knee Precaution Booklet Issued: Yes (comment) Precaution Comments: Reviewed knee precautions and supine HEP with pt.  Restrictions Weight Bearing Restrictions: Yes RLE Weight Bearing: Weight bearing as tolerated    Mobility  Bed Mobility Overal bed mobility: Needs Assistance Bed Mobility: Supine to Sit     Supine to sit: Supervision        Transfers Overall transfer level: Needs assistance Equipment used: Rolling walker (2 wheeled) Transfers: Sit to/from Stand Sit to Stand: Supervision         General transfer comment: Cues for hand placement to push from seated surface.  Pt slightly impulsive and remains to reach for RW initially.Pt able to correct with cueing.    Ambulation/Gait Ambulation/Gait assistance: Min guard Ambulation Distance (Feet): 250 Feet Assistive device: Rolling walker (2 wheeled) Gait Pattern/deviations: Step-to pattern;Decreased step length - right;Decreased step length - left;Decreased weight shift to right;Antalgic;Step-through pattern;Trunk flexed Gait velocity: Decreased    General Gait Details: Slow, antalgic gait. R  knee buckling noted.  Pt able to self correct with upper extremity strength. Verbal cues for sequencing using RW.  Lowered RW for improved fit.     Stairs Stairs: Yes Stairs assistance: Min guard Stair Management: One rail Right;Step to pattern;Forwards;Sideways Number of Stairs: 4 General stair comments: Cues for sequencing and use of hand grip.     Wheelchair Mobility    Modified Rankin (Stroke Patients Only)       Balance Overall balance assessment: Needs assistance Sitting-balance support: No upper extremity supported;Feet supported Sitting balance-Leahy Scale: Good       Standing balance-Leahy Scale: Poor Standing balance comment: Reliant on BUE support.                             Cognition Arousal/Alertness: Awake/alert Behavior During Therapy: WFL for tasks assessed/performed Overall Cognitive Status: Within Functional Limits for tasks assessed                                        Exercises Total Joint Exercises Ankle Circles/Pumps: AROM;Both;20 reps Quad Sets: AROM;Right;10 reps Towel Squeeze: AROM;Both;10 reps;Supine Short Arc Quad: AROM;Right;10 reps;Supine Heel Slides: AROM;Right;10 reps;Supine Hip ABduction/ADduction: AROM;Right;10 reps;Supine Straight Leg Raises: AROM;Right;10 reps;Supine Long Arc Quad: AROM;Right;10 reps;Seated Knee Flexion: AROM;Right;10 reps;AAROM;Seated(1x10 AROM and 1x10 AAROM.  ) Goniometric ROM: 85 degrees flexion in R knee.      General Comments        Pertinent Vitals/Pain Pain Assessment: Faces Faces Pain Scale: Hurts even more Pain Location: R  knee  Pain Descriptors / Indicators: Aching;Operative site guarding Pain Intervention(s): Monitored during session;Repositioned    Home Living                      Prior Function            PT Goals (current goals can now be found in the care plan section) Acute Rehab PT Goals Patient Stated Goal: to go home  Potential to Achieve  Goals: Good Progress towards PT goals: Progressing toward goals    Frequency    7X/week      PT Plan Current plan remains appropriate    Co-evaluation              AM-PAC PT "6 Clicks" Daily Activity  Outcome Measure  Difficulty turning over in bed (including adjusting bedclothes, sheets and blankets)?: A Little Difficulty moving from lying on back to sitting on the side of the bed? : A Little Difficulty sitting down on and standing up from a chair with arms (e.g., wheelchair, bedside commode, etc,.)?: Unable Help needed moving to and from a bed to chair (including a wheelchair)?: A Little Help needed walking in hospital room?: A Little Help needed climbing 3-5 steps with a railing? : A Little 6 Click Score: 16    End of Session Equipment Utilized During Treatment: Gait belt;Right knee immobilizer Activity Tolerance: Patient tolerated treatment well Patient left: with call bell/phone within reach;with family/visitor present;in chair Nurse Communication: Mobility status PT Visit Diagnosis: Other abnormalities of gait and mobility (R26.89);Unsteadiness on feet (R26.81);Muscle weakness (generalized) (M62.81);Pain Pain - Right/Left: Right Pain - part of body: Knee     Time: 9604-5409 PT Time Calculation (min) (ACUTE ONLY): 38 min  Charges:  $Gait Training: 8-22 mins $Therapeutic Exercise: 8-22 mins $Therapeutic Activity: 8-22 mins                    G Codes:       Joycelyn Rua, PTA pager [phone]    Florestine Avers 01/13/2018, 10:14 AM

## 2018-01-13 NOTE — Care Management Note (Signed)
Case Management Note  Patient Details  Name: Autumn Hamilton MRN: [id number] Date of Birth: March 18, 1951  Subjective/Objective:  67 yr old female s/p right total knee arthroplasty.                   Action/Plan:  Patient was preoperatively setup with [facility], no changes. She has all DME. Will have family support at discharge.    Expected Discharge Date:  01/13/18               Expected Discharge Plan:  Home w Home Health Services  In-House Referral:  NA  Discharge planning Services  CM Consult  Post Acute Care Choice:  Home Health Choice offered to:  Patient  DME Arranged:  (has RW and 3in1) DME Agency:  NA  HH Arranged:  PT HH Agency:  [facility] (formerly [facility])  Status of Service:  Completed, signed off  If discussed at [employer] Meetings, dates discussed:    Additional Comments:  Durenda GuthrieBrady, Shiva Sahagian Naomi, RN 01/13/2018, 12:37 PM

## 2018-01-13 NOTE — Progress Notes (Signed)
Discharge instructions completed with pt.  Pt verbalized understanding of the information.  Pt alert and oriented x4.  Pt denies chest pain, shortness of breath, dizziness, lightheadedness, and n/v.  Pt discharged home.   

## 2019-06-20 ENCOUNTER — Encounter: Payer: Self-pay | Admitting: Cardiology

## 2019-06-21 DIAGNOSIS — R079 Chest pain, unspecified: Secondary | ICD-10-CM | POA: Insufficient documentation

## 2019-06-21 NOTE — Progress Notes (Signed)
Patient referred by Vernie Shanks, MD for chest pain  Subjective:   Autumn Hamilton, female    DOB: 04-08-1951, 68 y.o.   MRN: [id number]  Chief Complaint  Patient presents with   Chest Pain   New Patient (Initial Visit)    HPI  68 y.o. African-American female with hypertension, family h/o CAD, referref for evaluation of chest pain.  Patient lives with her husband, stays active with regular walking 15,000 steps a day, as well as dancing.  She has family history of coronary artery disease in her father.  For last several weeks, patient has had left-sided pain, "cramp-like".  Pain is not particularly worse with exertion, but continues to occur intermittently.  It is not typically related to eating meals.  Patient tells me that she had a "heart scan" 10 years ago at [facility], which was reportedly normal.  She moved from [place] to [place] around 2017.   Past Medical History:  Diagnosis Date   Arthritis    Hypertension    Primary localized osteoarthritis of knee    Right     Past Surgical History:  Procedure Laterality Date   ABDOMINAL HYSTERECTOMY     bicep tear Left    JOINT REPLACEMENT     TONSILLECTOMY     torn meniscus Bilateral    TOTAL KNEE ARTHROPLASTY Left    TOTAL KNEE ARTHROPLASTY Left 08/25/2017   Procedure: TOTAL KNEE ARTHROPLASTY;  Surgeon: Renette Butters, MD;  Location: [facility];  Service: Orthopedics;  Laterality: Left;   TOTAL KNEE ARTHROPLASTY Right 01/12/2018   TOTAL KNEE ARTHROPLASTY Right 01/12/2018   Procedure: RIGHT TOTAL KNEE ARTHROPLASTY;  Surgeon: Renette Butters, MD;  Location: [facility];  Service: Orthopedics;  Laterality: Right;     Social History   Socioeconomic History   Marital status: Married    Spouse name: Not on file   Number of children: 0   Years of education: Not on file   Highest education level: Not on file  Occupational History   Not on file  Social Needs   Financial resource strain: Not on  file   Food insecurity    Worry: Not on file    Inability: Not on file   Transportation needs    Medical: Not on file    Non-medical: Not on file  Tobacco Use   Smoking status: Never Smoker   Smokeless tobacco: Never Used  Substance and Sexual Activity   Alcohol use: No    Frequency: Never   Drug use: No   Sexual activity: Not on file  Lifestyle   Physical activity    Days per week: Not on file    Minutes per session: Not on file   Stress: Not on file  Relationships   Social connections    Talks on phone: Not on file    Gets together: Not on file    Attends religious service: Not on file    Active member of club or organization: Not on file    Attends meetings of clubs or organizations: Not on file    Relationship status: Not on file   Intimate partner violence    Fear of current or ex partner: Not on file    Emotionally abused: Not on file    Physically abused: Not on file    Forced sexual activity: Not on file  Other Topics Concern   Not on file  Social History Narrative   Not on file  Family History  Problem Relation Age of Onset   Hypertension Mother    Heart disease Father    Hypertension Sister    Hypertension Sister    Hypertension Sister    Hypertension Sister      Current Outpatient Medications on File Prior to Visit  Medication Sig Dispense Refill   Ascorbic Acid (VITAMIN C ER PO) Take by mouth daily.     aspirin EC 81 MG tablet Take 1 tablet (81 mg total) by mouth 2 (two) times daily. For DVT prophylaxis (Patient taking differently: Take 81 mg by mouth daily. For DVT prophylaxis) 60 tablet 0   Biotin 5000 MCG TABS Take 1 tablet by mouth daily.     ECHINACEA PO Take by mouth daily.     eplerenone (INSPRA) 50 MG tablet Take 100 mg by mouth daily.     Glucos-Chond-Hyal Ac-Ca Fructo (MOVE FREE JOINT HEALTH ADVANCE PO) Take by mouth daily.     loratadine (KLS ALLERCLEAR) 10 MG tablet Take 10 mg by mouth daily.      Methylcellulose, Laxative, (CITRUCEL PO) Take by mouth daily.     Multiple Vitamins-Minerals (MULTIVITAMIN WITH MINERALS) tablet Take 1 tablet by mouth daily.     Zinc Sulfate (ZINC-220 PO) Take by mouth daily.     No current facility-administered medications on file prior to visit.     Cardiovascular studies:  EKG 06/22/2019: Sinus rhythm 87 bpm. Normal EKG.   Recent labs: 02/01/2019: Glucose 100. BUN/Cr 13/0.79. eGFR 73. Na/K 141/4.1.   Review of Systems  Constitution: Negative for decreased appetite, malaise/fatigue, weight gain and weight loss.  HENT: Negative for congestion.   Eyes: Negative for visual disturbance.  Cardiovascular: Negative for chest pain, dyspnea on exertion, leg swelling, palpitations and syncope.  Respiratory: Negative for cough.   Endocrine: Negative for cold intolerance.  Hematologic/Lymphatic: Does not bruise/bleed easily.  Skin: Negative for itching and rash.  Musculoskeletal: Negative for myalgias.  Gastrointestinal: Negative for abdominal pain, nausea and vomiting.  Genitourinary: Negative for dysuria.  Neurological: Negative for dizziness and weakness.  Psychiatric/Behavioral: The patient is not nervous/anxious.   All other systems reviewed and are negative.        Vitals:   06/22/19 0818  BP: (!) 141/74  Pulse: 89  SpO2: 98%     Body mass index is 34.76 kg/m. Filed Weights   06/22/19 0818  Weight: 178 lb (80.7 kg)     Objective:   Physical Exam  Constitutional: She is oriented to person, place, and time. She appears well-developed and well-nourished. No distress.  HENT:  Head: Normocephalic and atraumatic.  Eyes: Pupils are equal, round, and reactive to light. Conjunctivae are normal.  Neck: No JVD present.  Cardiovascular: Normal rate, regular rhythm and intact distal pulses.  No murmur heard. Pulmonary/Chest: Effort normal and breath sounds normal. She has no wheezes. She has no rales.  Abdominal: Soft. Bowel sounds  are normal. There is no rebound.  Musculoskeletal:        General: No edema.  Lymphadenopathy:    She has no cervical adenopathy.  Neurological: She is alert and oriented to person, place, and time. No cranial nerve deficit.  Skin: Skin is warm and dry.  Psychiatric: She has a normal mood and affect.  Nursing note and vitals reviewed.         Assessment & Recommendations:   68 y.o. African-American female with hypertension, family h/o CAD, referref for evaluation of chest pain.   Chest pain: Atypical chest pain.  Risk factors for CAD include hypertension and family history.  Given her excellent baseline functional capacity, low levels of CAD may be underestimated on functional testing.  Recommend coronary CT angiogram for anatomical evaluation. I will obtain lipid panel.  Hypertension: Suboptimal control.  Increase verapamil to 240 mg daily.  This will also help with rate control around the time of CTA.  I also gave her additional metoprolol tartrate for use during CT scan.  Continue eplerenone 100 mg daily.  Physical activity recommendation (The Physical Activity Guidelines for Americans. JAMA 2018;Nov 12) At least 150-300 minutes a week of moderate-intensity, or 75-150 minutes a week of vigorous-intensity aerobic physical activity, or an equivalent combination of moderate- and vigorous-intensity aerobic activity. Adults should perform muscle-strengthening activities on 2 or more days a week. Older adults should do multicomponent physical activity that includes balance training as well as aerobic and muscle-strengthening activities. Benefits of increased physical activity include lower risk of mortality including cardiovascular mortality, lower risk of cardiovascular events and associated risk factors (hypertension and diabetes), and lower risk of many cancers (including bladder, breast, colon, endometrium, esophagus, kidney, lung, and stomach). Additional improvments have been seen in  cognition, risk of dementia, anxiety and depression, improved bone health, lower risk of falls, and associated injuries.  Dietary recommendation The 2019 ACC/AHA guidelines promote nutrition as a main fixture of cardiovascular wellness, with a recommendation for a varied diet of fruit, vegetables, fish, legumes, and whole grains (Class I), as well as recommendations to reduce sodium, cholesterol, processed meats, and refined sugars (Class IIa recommendation).10 Sodium intake, a topic of some controversy as of late, is recommended to be kept at 1,500 mg/day or less, far below the average daily intake in the [place] of 3,409 mg/day, and notably below that of previous [place] recommendations for <2,353m/day.10,11 For those unable to reach 1,500 mg/day, they recommend at least a reduction of 1000 mg/day.  A Pesco-Mediterranean Diet With Intermittent Fasting: JACC Review Topic of the Week. J Am Coll Cardiol 26644;03:4742-5956Pesco-Mediterranean diet, it is supplemented with extra-virgin olive oil (EVOO), which is the principle fat source, along with moderate amounts of dairy (particularly yogurt and cheese) and eggs, as well as modest amounts of alcohol consumption (ideally red wine with the evening meal), but few red and processed meats.   Thank you for referring the patient to [facility] Please feel free to contact with any questions.  MNigel Mormon MD [facility] Pager: [phone]: [phone]

## 2019-06-22 ENCOUNTER — Other Ambulatory Visit: Payer: Self-pay

## 2019-06-22 ENCOUNTER — Ambulatory Visit (INDEPENDENT_AMBULATORY_CARE_PROVIDER_SITE_OTHER): Payer: Medicare Other | Admitting: Cardiology

## 2019-06-22 ENCOUNTER — Encounter: Payer: Self-pay | Admitting: Cardiology

## 2019-06-22 VITALS — BP 141/74 | HR 89 | Ht 60.0 in | Wt 178.0 lb

## 2019-06-22 DIAGNOSIS — R072 Precordial pain: Secondary | ICD-10-CM | POA: Diagnosis not present

## 2019-06-22 DIAGNOSIS — R079 Chest pain, unspecified: Secondary | ICD-10-CM

## 2019-06-22 DIAGNOSIS — R0789 Other chest pain: Secondary | ICD-10-CM | POA: Diagnosis not present

## 2019-06-22 MED ORDER — METOPROLOL TARTRATE 25 MG PO TABS: 25.0000 mg | ORAL_TABLET | Freq: Two times a day (BID) | ORAL | 0 refills | Status: DC

## 2019-06-22 MED ORDER — VERAPAMIL HCL ER 240 MG PO TBCR: 120.0000 mg | EXTENDED_RELEASE_TABLET | Freq: Every day | ORAL | 0 refills | Status: DC

## 2019-06-22 MED ORDER — VERAPAMIL HCL ER 240 MG PO TBCR: 240.0000 mg | EXTENDED_RELEASE_TABLET | Freq: Every day | ORAL | 0 refills | Status: DC

## 2019-06-22 NOTE — Patient Instructions (Addendum)
Your cardiac CT will be scheduled at the locations below:   [facility]  [address]  [place], [place] [address]  [phone]    If scheduled at [facility], please arrive at the [facility] main entrance of [facility] 30-45 minutes prior to test start time.  Proceed to the [facility] (first floor) to check-in and test prep.   Please follow these instructions carefully (unless otherwise directed):   On the Night Before the Test:   Be sure to Drink plenty of water.   Do not consume any caffeinated/decaffeinated beverages or chocolate 12 hours prior to your test.   Do not take any antihistamines 12 hours prior to your test.   If the patient has contrast allergy:  Patient will need a prescription for Prednisone and very clear instructions (as follows):  1. Prednisone 50 mg - take 13 hours prior to test  2. Take another Prednisone 50 mg 7 hours prior to test  3. Take another Prednisone 50 mg 1 hour prior to test  4. Take Benadryl 50 mg 1 hour prior to test   Patient must complete all four doses of above prophylactic medications.   Patient will need a ride after test due to Benadryl.   On the Day of the Test:   Drink plenty of water. Do not drink any water within one hour of the test.   Do not eat any food 4 hours prior to the test.   You may take your regular medications prior to the test.   - Metoprolol tartarate   Start 2 days before CT scan.    Take a dose 2 hour before the CT scan   Take the remaining pills with you.   You may stop it after the CT scan, unless specified otherwise by me.    FEMALES- please wear underwire-free bra if available          After the Test:   Drink plenty of water.   After receiving IV contrast, you may experience a mild flushed feeling. This is normal.   On occasion, you may experience a mild rash up to 24 hours after the test. This is not dangerous. If this occurs, you can take  Benadryl 25 mg and increase your fluid intake.   If you experience trouble breathing, this can be serious. If it is severe call 911 IMMEDIATELY. If it is mild, please call our office.   If you take any of these medications: Glipizide/Metformin, Avandament, Glucavance, please do not take 48 hours after completing test unless otherwise instructed.     Please contact the cardiac imaging nurse navigator should you have any questions/concerns  Sara Wallace, RN Navigator Cardiac Imaging  [facility]  [phone] Office  [phone] Cell   

## 2019-06-22 NOTE — Addendum Note (Signed)
Addended by: Nigel Mormon on: 06/22/2019 04:14 PM   Modules accepted: Orders

## 2019-06-27 ENCOUNTER — Other Ambulatory Visit: Payer: Self-pay

## 2019-06-27 DIAGNOSIS — R0789 Other chest pain: Secondary | ICD-10-CM

## 2019-06-27 MED ORDER — METOPROLOL TARTRATE 25 MG PO TABS: 25.0000 mg | ORAL_TABLET | Freq: Two times a day (BID) | ORAL | 0 refills | Status: DC

## 2019-06-27 MED ORDER — METOPROLOL TARTRATE 25 MG PO TABS: 25.0000 mg | ORAL_TABLET | Freq: Two times a day (BID) | ORAL | 0 refills | Status: AC

## 2019-06-28 ENCOUNTER — Other Ambulatory Visit (HOSPITAL_COMMUNITY): Payer: Self-pay | Admitting: Cardiology

## 2019-06-29 LAB — BASIC METABOLIC PANEL
BUN/Creatinine Ratio: 16 (ref 12–28)
BUN: 13 mg/dL (ref 8–27)
CO2: 27 mmol/L (ref 20–29)
Calcium: 10.2 mg/dL (ref 8.7–10.3)
Chloride: 101 mmol/L (ref 96–106)
Creatinine, Ser: 0.81 mg/dL (ref 0.57–1.00)
GFR calc Af Amer: 88 mL/min/{1.73_m2} (ref >59)
GFR calc non Af Amer: 76 mL/min/{1.73_m2} (ref >59)
Glucose: 91 mg/dL (ref 65–99)
Potassium: 4.3 mmol/L (ref 3.5–5.2)
Sodium: 141 mmol/L (ref 134–144)

## 2019-06-29 LAB — LIPID PANEL
Chol/HDL Ratio: 3.5 ratio (ref 0.0–4.4)
Cholesterol, Total: 211 mg/dL — ABNORMAL HIGH (ref 100–199)
HDL: 60 mg/dL (ref >39)
LDL Chol Calc (NIH): 135 mg/dL — ABNORMAL HIGH (ref 0–99)
Triglycerides: 89 mg/dL (ref 0–149)
VLDL Cholesterol Cal: 16 mg/dL (ref 5–40)

## 2019-07-05 ENCOUNTER — Other Ambulatory Visit: Payer: Self-pay

## 2019-07-05 MED ORDER — VERAPAMIL HCL ER 240 MG PO TBCR: 240.0000 mg | EXTENDED_RELEASE_TABLET | Freq: Every day | ORAL | 0 refills | Status: DC

## 2019-07-05 MED ORDER — ROSUVASTATIN CALCIUM 10 MG PO TABS: 10.0000 mg | ORAL_TABLET | Freq: Every day | ORAL | 1 refills | Status: DC

## 2019-07-05 NOTE — Progress Notes (Signed)
Called pt to inform her about her lab results. Send Crestor 10 mg 1 tab daily to pharmacy. Pt understood.

## 2019-07-14 ENCOUNTER — Telehealth: Payer: Self-pay | Admitting: Cardiology

## 2019-07-14 NOTE — Telephone Encounter (Signed)
Does she have contrast dye allergy? If yes, then she would. If not, then no.

## 2019-07-15 ENCOUNTER — Telehealth (HOSPITAL_COMMUNITY): Payer: Self-pay | Admitting: Emergency Medicine

## 2019-07-15 NOTE — Telephone Encounter (Signed)
Reaching out to patient to offer assistance regarding upcoming cardiac imaging study; pt verbalizes understanding of appt date/time, parking situation and where to check in, pre-test NPO status and medications ordered, and verified current allergies; name and call back number provided for further questions should they arise Travian Kerner RN Navigator Cardiac Imaging [facility] [phone] office [phone] cell 

## 2019-07-18 ENCOUNTER — Encounter: Payer: Medicare Other | Admitting: *Deleted

## 2019-07-18 ENCOUNTER — Ambulatory Visit (HOSPITAL_COMMUNITY)
Admission: RE | Admit: 2019-07-18 | Discharge: 2019-07-18 | Disposition: A | Discharge status: Home / Self Care | Payer: Medicare Other | Source: Ambulatory Visit | Attending: Cardiology | Admitting: Cardiology

## 2019-07-18 ENCOUNTER — Other Ambulatory Visit: Payer: Self-pay

## 2019-07-18 DIAGNOSIS — R0789 Other chest pain: Secondary | ICD-10-CM

## 2019-07-18 DIAGNOSIS — Z006 Encounter for examination for normal comparison and control in clinical research program: Secondary | ICD-10-CM

## 2019-07-18 MED ORDER — NITROGLYCERIN 0.4 MG SL SUBL
0.8000 mg | SUBLINGUAL_TABLET | Freq: Once | SUBLINGUAL | Status: AC
  Administered 2019-07-18: 10:00:00 0.8 mg via SUBLINGUAL

## 2019-07-18 MED ORDER — NITROGLYCERIN 0.4 MG SL SUBL
SUBLINGUAL_TABLET | SUBLINGUAL | Status: AC
  Administered 2019-07-18: 0.8 mg via SUBLINGUAL
  Filled 2019-07-18: qty 2

## 2019-07-18 MED ORDER — IOHEXOL 350 MG/ML SOLN
100.0000 mL | Freq: Once | INTRAVENOUS | Status: AC | PRN
  Administered 2019-07-18: 90 mL via INTRAVENOUS

## 2019-07-18 NOTE — Research (Signed)
CADFEM Informed Consent                  Subject Name:   Autumn Hamilton   Subject met inclusion and exclusion criteria.  The informed consent form, study requirements and expectations were reviewed with the subject and questions and concerns were addressed prior to the signing of the consent form.  The subject verbalized understanding of the trial requirements.  The subject agreed to participate in the CADFEM trial and signed the informed consent.  The informed consent was obtained prior to performance of any protocol-specific procedures for the subject.  A copy of the signed informed consent was given to the subject and a copy was placed in the subject's medical record.   [place] Lilburn Straw, Research Assistant  07/18/2019  08:16 a.m.

## 2019-07-19 NOTE — Progress Notes (Signed)
Normal CT angiogram. No coronary artery disease. Continue heart healthy diet and lifestyle. Continue follow up with Dr. Jacelyn Grip re: hypertension management.  Thanks MJP

## 2019-07-20 NOTE — Progress Notes (Signed)
Called pt husband answered and was informed about pt results.

## 2019-07-21 ENCOUNTER — Telehealth: Payer: Self-pay

## 2019-07-21 NOTE — Telephone Encounter (Signed)
Pt aware; she was going to wait until she talks to pcp and see what they suggest

## 2019-07-21 NOTE — Telephone Encounter (Signed)
Reduce to 5 mg. If no improvement, can switch to a lower intensity statin.  Thanks MJP

## 2019-07-21 NOTE — Telephone Encounter (Signed)
Telephone encounter:  Reason for call: Pt says that she cannot take the crestor 10mg  it is making her dizzy; can we change to something else   Usual provider: MP  Last office visit: 06/22/2019  Next office visit: n/a   Last hospitalization: n/a   Current Outpatient Medications on File Prior to Visit  Medication Sig Dispense Refill  . Ascorbic Acid (VITAMIN C ER PO) Take by mouth daily.    Marland Kitchen aspirin EC 81 MG tablet Take 1 tablet (81 mg total) by mouth 2 (two) times daily. For DVT prophylaxis (Patient taking differently: Take 81 mg by mouth daily. For DVT prophylaxis) 60 tablet 0  . Biotin 5000 MCG TABS Take 1 tablet by mouth daily.    Marland Kitchen ECHINACEA PO Take by mouth daily.    Marland Kitchen eplerenone (INSPRA) 50 MG tablet Take 100 mg by mouth daily.    . Glucos-Chond-Hyal Ac-Ca Fructo (MOVE FREE JOINT HEALTH ADVANCE PO) Take by mouth daily.    Marland Kitchen loratadine (KLS ALLERCLEAR) 10 MG tablet Take 10 mg by mouth daily.    . Methylcellulose, Laxative, (CITRUCEL PO) Take by mouth daily.    . metoprolol tartrate (LOPRESSOR) 25 MG tablet Take 1 tablet (25 mg total) by mouth 2 (two) times daily. 10 tablet 0  . Multiple Vitamins-Minerals (MULTIVITAMIN WITH MINERALS) tablet Take 1 tablet by mouth daily.    . rosuvastatin (CRESTOR) 10 MG tablet Take 1 tablet (10 mg total) by mouth daily. 30 tablet 1  . verapamil (CALAN-SR) 240 MG CR tablet Take 1 tablet (240 mg total) by mouth daily. 30 tablet 0  . Zinc Sulfate (ZINC-220 PO) Take by mouth daily.     No current facility-administered medications on file prior to visit.

## 2019-07-31 ENCOUNTER — Other Ambulatory Visit: Payer: Self-pay | Admitting: Cardiology

## 2019-08-09 ENCOUNTER — Other Ambulatory Visit: Payer: Self-pay | Admitting: Cardiology

## 2019-08-13 ENCOUNTER — Other Ambulatory Visit: Payer: Self-pay | Admitting: Cardiology

## 2019-09-02 ENCOUNTER — Ambulatory Visit: Payer: Medicare Other | Attending: Internal Medicine

## 2019-09-02 DIAGNOSIS — Z23 Encounter for immunization: Secondary | ICD-10-CM | POA: Insufficient documentation

## 2019-09-05 NOTE — Progress Notes (Signed)
   Covid-19 Vaccination Clinic  Name:  Autumn Hamilton    MRN: [id number] DOB: 03-Oct-1950  09/02/2019  Autumn Hamilton was observed post Covid-19 immunization for 15 minutes without incidence. She was provided with Vaccine Information Sheet and instruction to access the V-Safe system.   Autumn Hamilton was instructed to call 911 with any severe reactions post vaccine: Marland Kitchen Difficulty breathing  . Swelling of your face and throat  . A fast heartbeat  . A bad rash all over your body  . Dizziness and weakness    Immunizations Administered    Name Date Dose VIS Date Route   Moderna COVID-19 Vaccine 09/02/2019 10:59 AM 0.5 mL 07/12/2019 Intramuscular   Manufacturer: Moderna   Lot: [id number]   NDC: 16742-552-58

## 2019-09-30 ENCOUNTER — Ambulatory Visit: Payer: Medicare Other

## 2019-10-07 ENCOUNTER — Ambulatory Visit: Payer: Medicare Other | Attending: Internal Medicine

## 2019-10-07 DIAGNOSIS — Z23 Encounter for immunization: Secondary | ICD-10-CM | POA: Insufficient documentation

## 2019-10-07 NOTE — Progress Notes (Signed)
   Covid-19 Vaccination Clinic  Name:  Autumn Hamilton    MRN: [id number] DOB: July 06, 1951  10/07/2019  [name] was observed post Covid-19 immunization for 15 minutes without incidence. She was provided with Vaccine Information Sheet and instruction to access the V-Safe system.   [name] was instructed to call 911 with any severe reactions post vaccine: Marland Kitchen Difficulty breathing  . Swelling of your face and throat  . A fast heartbeat  . A bad rash all over your body  . Dizziness and weakness    Immunizations Administered    Name Date Dose VIS Date Route   Moderna COVID-19 Vaccine 10/07/2019  8:49 AM 0.5 mL 07/12/2019 Intramuscular   Manufacturer: Moderna   Lot: [id number]   NDC: 98614-830-73

## 2019-10-12 ENCOUNTER — Other Ambulatory Visit: Payer: Self-pay | Admitting: Cardiology

## 2020-07-26 ENCOUNTER — Telehealth: Payer: Self-pay

## 2020-07-26 NOTE — Telephone Encounter (Signed)
Patient called stated that her cholesterol medication (Rosuvastatin) is making her feel dizzy (drunk like feeling) and little chest pains (burning feeling) and wants to know if she can stop it until her next blood work to see if the diet and exercise she has been working on has been helping, and if it hasn't, she will probably want to try something else. She is asking if you want her to come in? Please advise.   MA's: [phone]

## 2020-07-26 NOTE — Telephone Encounter (Signed)
Okay to stop rosuvastatin. Can try pravastatin 20 mg. Please send 90 pills 1 refill. Further follow up can be continued with PCP.  Thanks MJP

## 2020-07-27 ENCOUNTER — Other Ambulatory Visit: Payer: Self-pay

## 2020-08-07 ENCOUNTER — Other Ambulatory Visit: Payer: Self-pay

## 2020-08-07 ENCOUNTER — Other Ambulatory Visit: Payer: Self-pay | Admitting: Family Medicine

## 2020-08-07 DIAGNOSIS — E2839 Other primary ovarian failure: Secondary | ICD-10-CM

## 2020-08-07 MED ORDER — PRAVASTATIN SODIUM 20 MG PO TABS: 20.0000 mg | ORAL_TABLET | Freq: Every day | ORAL | 1 refills | Status: DC

## 2020-08-07 NOTE — Telephone Encounter (Signed)
Patient aware, Rx sent to her pharmacy on 07/27/2020.

## 2020-10-26 DIAGNOSIS — Z1231 Encounter for screening mammogram for malignant neoplasm of breast: Secondary | ICD-10-CM | POA: Diagnosis not present

## 2020-10-31 DIAGNOSIS — H5213 Myopia, bilateral: Secondary | ICD-10-CM | POA: Diagnosis not present

## 2020-10-31 DIAGNOSIS — H2513 Age-related nuclear cataract, bilateral: Secondary | ICD-10-CM | POA: Diagnosis not present

## 2020-10-31 DIAGNOSIS — H43812 Vitreous degeneration, left eye: Secondary | ICD-10-CM | POA: Diagnosis not present

## 2020-11-23 ENCOUNTER — Other Ambulatory Visit: Payer: Medicare Other

## 2021-01-29 ENCOUNTER — Other Ambulatory Visit: Payer: Self-pay | Admitting: Cardiology

## 2021-02-08 DIAGNOSIS — M79644 Pain in right finger(s): Secondary | ICD-10-CM | POA: Diagnosis not present

## 2021-02-18 DIAGNOSIS — R7303 Prediabetes: Secondary | ICD-10-CM | POA: Diagnosis not present

## 2021-02-18 DIAGNOSIS — I10 Essential (primary) hypertension: Secondary | ICD-10-CM | POA: Diagnosis not present

## 2021-02-18 DIAGNOSIS — E7800 Pure hypercholesterolemia, unspecified: Secondary | ICD-10-CM | POA: Diagnosis not present

## 2021-02-22 DIAGNOSIS — R7303 Prediabetes: Secondary | ICD-10-CM | POA: Diagnosis not present

## 2021-02-22 DIAGNOSIS — I10 Essential (primary) hypertension: Secondary | ICD-10-CM | POA: Diagnosis not present

## 2021-02-22 DIAGNOSIS — E7800 Pure hypercholesterolemia, unspecified: Secondary | ICD-10-CM | POA: Diagnosis not present

## 2021-02-27 DIAGNOSIS — G5601 Carpal tunnel syndrome, right upper limb: Secondary | ICD-10-CM | POA: Diagnosis not present

## 2021-03-19 DIAGNOSIS — G5601 Carpal tunnel syndrome, right upper limb: Secondary | ICD-10-CM | POA: Diagnosis not present

## 2021-05-06 ENCOUNTER — Other Ambulatory Visit: Payer: Self-pay

## 2021-05-06 ENCOUNTER — Ambulatory Visit
Admission: RE | Admit: 2021-05-06 | Discharge: 2021-05-06 | Disposition: A | Discharge status: Home / Self Care | Payer: Medicare Other | Source: Ambulatory Visit | Attending: Family Medicine | Admitting: Family Medicine

## 2021-05-06 DIAGNOSIS — E2839 Other primary ovarian failure: Secondary | ICD-10-CM

## 2021-05-06 DIAGNOSIS — Z780 Asymptomatic menopausal state: Secondary | ICD-10-CM | POA: Diagnosis not present

## 2021-05-06 DIAGNOSIS — M85832 Other specified disorders of bone density and structure, left forearm: Secondary | ICD-10-CM | POA: Diagnosis not present

## 2021-07-23 ENCOUNTER — Other Ambulatory Visit: Payer: Self-pay | Admitting: Cardiology

## 2021-08-16 DIAGNOSIS — E7800 Pure hypercholesterolemia, unspecified: Secondary | ICD-10-CM | POA: Diagnosis not present

## 2021-08-16 DIAGNOSIS — R7303 Prediabetes: Secondary | ICD-10-CM | POA: Diagnosis not present

## 2021-08-23 DIAGNOSIS — Z1211 Encounter for screening for malignant neoplasm of colon: Secondary | ICD-10-CM | POA: Diagnosis not present

## 2021-08-23 DIAGNOSIS — Z0000 Encounter for general adult medical examination without abnormal findings: Secondary | ICD-10-CM | POA: Diagnosis not present

## 2021-08-23 DIAGNOSIS — M5432 Sciatica, left side: Secondary | ICD-10-CM | POA: Diagnosis not present

## 2021-08-26 DIAGNOSIS — M545 Low back pain, unspecified: Secondary | ICD-10-CM | POA: Diagnosis not present

## 2021-08-29 DIAGNOSIS — M4046 Postural lordosis, lumbar region: Secondary | ICD-10-CM | POA: Diagnosis not present

## 2021-08-29 DIAGNOSIS — M5459 Other low back pain: Secondary | ICD-10-CM | POA: Diagnosis not present

## 2021-08-29 DIAGNOSIS — M6281 Muscle weakness (generalized): Secondary | ICD-10-CM | POA: Diagnosis not present

## 2021-08-29 DIAGNOSIS — M62830 Muscle spasm of back: Secondary | ICD-10-CM | POA: Diagnosis not present

## 2021-08-30 DIAGNOSIS — H8110 Benign paroxysmal vertigo, unspecified ear: Secondary | ICD-10-CM | POA: Diagnosis not present

## 2021-08-30 DIAGNOSIS — R42 Dizziness and giddiness: Secondary | ICD-10-CM | POA: Diagnosis not present

## 2021-09-05 DIAGNOSIS — M6281 Muscle weakness (generalized): Secondary | ICD-10-CM | POA: Diagnosis not present

## 2021-09-05 DIAGNOSIS — M5459 Other low back pain: Secondary | ICD-10-CM | POA: Diagnosis not present

## 2021-09-30 DIAGNOSIS — M25561 Pain in right knee: Secondary | ICD-10-CM | POA: Diagnosis not present

## 2021-09-30 DIAGNOSIS — M25562 Pain in left knee: Secondary | ICD-10-CM | POA: Diagnosis not present

## 2021-10-16 IMAGING — CT CT HEART MORP W/ CTA COR W/ SCORE W/ CA W/CM &/OR W/O CM
2 of 8 series · 4 of 20 positions shown, 5 images · IV contrast (APPLIED)
Comparison: None.
COMPARISON: None.

Addendum:
EXAM:
OVER-READ INTERPRETATION  CT CHEST

The following report is an over-read performed by radiologist Dr.
Javaughny Jubby [REDACTED] on 07/18/2019. This
over-read does not include interpretation of cardiac or coronary
anatomy or pathology. The coronary calcium score/coronary CTA
interpretation by the cardiologist is attached.
HISTORY: Chest pain, chronic, low-mod prob of CAD Chest pain
Cardiac/Coronary CT
TECHNIQUE: The patient was scanned on a Siemens Force scanner.
PROTOCOL: A 120 kV prospective scan was triggered in the descending thoracic
aorta at 111 HU's. Axial non-contrast 3 mm slices were carried out
through the heart. The data set was analyzed on a dedicated work
station and scored using the Agatson method. Gantry rotation speed
was 250 msecs and collimation was 0.6 mm. Beta blockade and 0.8 mg
of sl NTG was given. The 3D data set was reconstructed in 5%
intervals of 35-75% of the R-R cycle. Diastolic phases were analyzed
on a dedicated work station using MPR, MIP and VRT modes. The
patient received 90mL OMNIPAQUE IOHEXOL 350 MG/ML SOLN of contrast.

[Series 9: ts syst sharp 43 % · axial · 0.30mm/px · z∈[-205,-172]mm · 2 of 246 slices shown]
[im 82/246  lung]
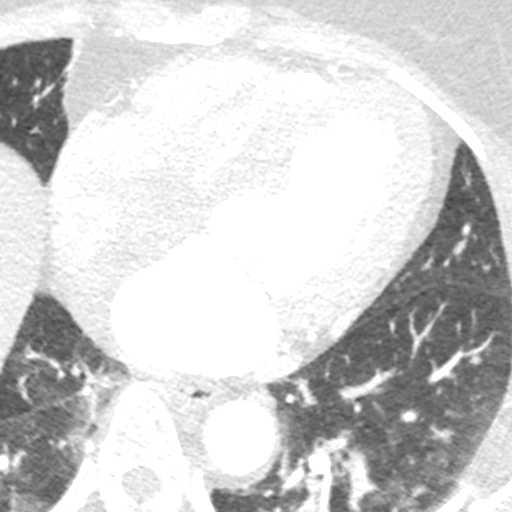
[im 164/246  lung]
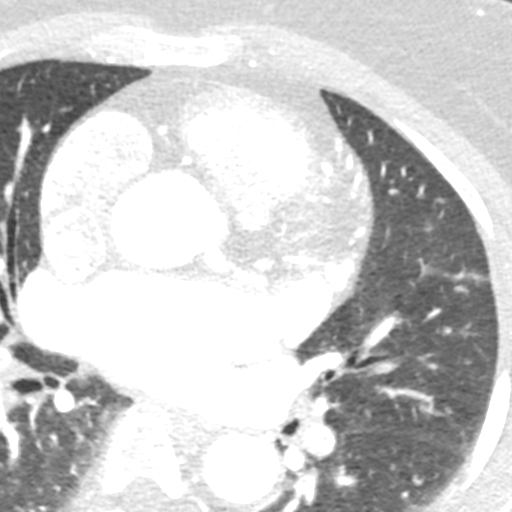

[Series 13: best systolic reform · axial · 0.30mm/px · z∈[-205,-172]mm · 2 of 246 slices shown, 3 images]
[im 82/246  vessel]
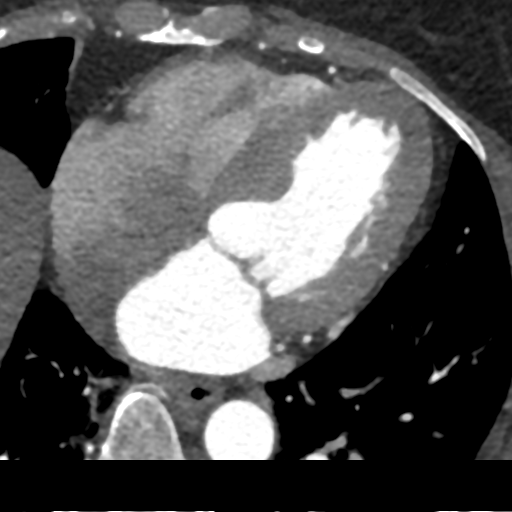
[im 82/246  lung]
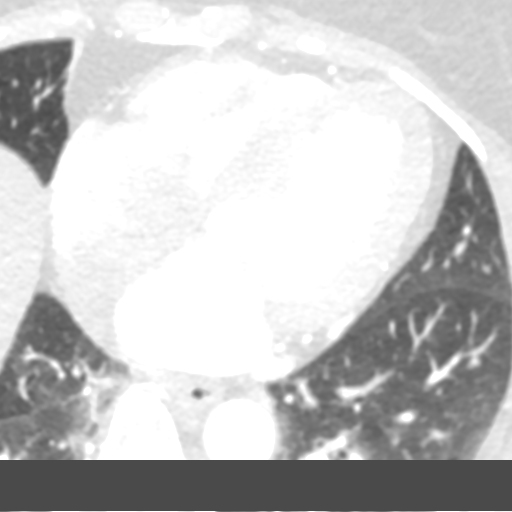
[im 164/246  vessel]
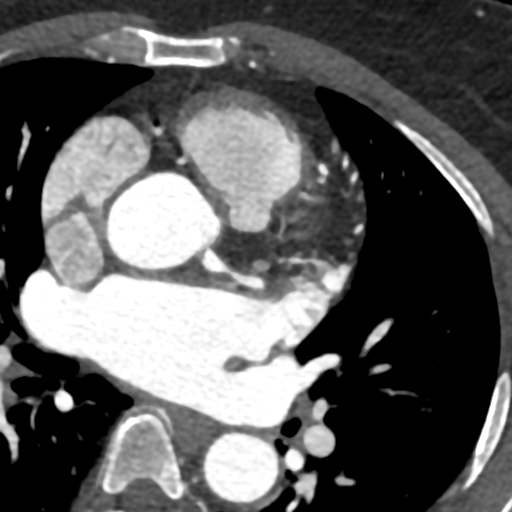

[4 of 20 positions shown; findings below may reference images not displayed]

FINDINGS: Within the visualized portions of the thorax there are no suspicious
appearing pulmonary nodules or masses, there is no acute
consolidative airspace disease, no pleural effusions, no
pneumothorax and no lymphadenopathy. Visualized portions of the
upper abdomen are unremarkable. There are no aggressive appearing
lytic or blastic lesions noted in the visualized portions of the
skeleton.
IMPRESSION: No significant incidental noncardiac findings are noted.
FINDINGS: Coronary calcium score: The patient's coronary artery calcium score
is 0, which places the patient in the 0 percentile.

Coronary arteries: Normal coronary origins.  Left dominance.

Right Coronary Artery: Small caliber vessel. No significant plaque
or stenosis.

Left Main Coronary Artery: Normal caliber vessel. No significant
plaque or stenosis.

Left Anterior Descending Coronary Artery: Normal caliber vessel. No
significant plaque or stenosis. Gives rise to 2 medium and 2 small
diagonal branches.

Left Circumflex Artery: Normal caliber vessel, gives rise to PDA. No
significant plaque or stenosis. Gives rise to one large OM branch.

Aorta: Normal size, 31 mm at the mid ascending aorta (level of the
PA bifurcation) measured double oblique. No calcifications. No
dissection.

Aortic Valve: No calcifications. Trileaflet.

Other findings:

Normal pulmonary vein drainage into the left atrium.

Normal left atrial appendage without a thrombus.

Normal size of the pulmonary artery.
IMPRESSION: 1. No evidence of CAD, CADRADS = 0.

2. Coronary calcium score of 0. This was 0 percentile for age and
sex matched control.

3. Normal coronary origin with left dominance.

*** End of Addendum ***
EXAM:
OVER-READ INTERPRETATION  CT CHEST

The following report is an over-read performed by radiologist Dr.
Javaughny Jubby [REDACTED] on 07/18/2019. This
over-read does not include interpretation of cardiac or coronary
anatomy or pathology. The coronary calcium score/coronary CTA
interpretation by the cardiologist is attached.
FINDINGS: Within the visualized portions of the thorax there are no suspicious
appearing pulmonary nodules or masses, there is no acute
consolidative airspace disease, no pleural effusions, no
pneumothorax and no lymphadenopathy. Visualized portions of the
upper abdomen are unremarkable. There are no aggressive appearing
lytic or blastic lesions noted in the visualized portions of the
skeleton.
IMPRESSION: No significant incidental noncardiac findings are noted.

## 2021-11-01 DIAGNOSIS — Z1231 Encounter for screening mammogram for malignant neoplasm of breast: Secondary | ICD-10-CM | POA: Diagnosis not present

## 2021-11-07 DIAGNOSIS — H25013 Cortical age-related cataract, bilateral: Secondary | ICD-10-CM | POA: Diagnosis not present

## 2021-11-07 DIAGNOSIS — H5213 Myopia, bilateral: Secondary | ICD-10-CM | POA: Diagnosis not present

## 2021-11-07 DIAGNOSIS — H2513 Age-related nuclear cataract, bilateral: Secondary | ICD-10-CM | POA: Diagnosis not present

## 2022-02-18 DIAGNOSIS — E7800 Pure hypercholesterolemia, unspecified: Secondary | ICD-10-CM | POA: Diagnosis not present

## 2022-02-18 DIAGNOSIS — R7303 Prediabetes: Secondary | ICD-10-CM | POA: Diagnosis not present

## 2022-05-27 DIAGNOSIS — R7303 Prediabetes: Secondary | ICD-10-CM | POA: Diagnosis not present

## 2022-06-04 DIAGNOSIS — I10 Essential (primary) hypertension: Secondary | ICD-10-CM | POA: Diagnosis not present

## 2022-06-04 DIAGNOSIS — E876 Hypokalemia: Secondary | ICD-10-CM | POA: Diagnosis not present

## 2022-06-04 DIAGNOSIS — R7303 Prediabetes: Secondary | ICD-10-CM | POA: Diagnosis not present

## 2022-06-04 DIAGNOSIS — E782 Mixed hyperlipidemia: Secondary | ICD-10-CM | POA: Diagnosis not present

## 2022-06-24 DIAGNOSIS — E7800 Pure hypercholesterolemia, unspecified: Secondary | ICD-10-CM | POA: Diagnosis not present

## 2022-06-24 DIAGNOSIS — I10 Essential (primary) hypertension: Secondary | ICD-10-CM | POA: Diagnosis not present

## 2022-06-24 DIAGNOSIS — E8889 Other specified metabolic disorders: Secondary | ICD-10-CM | POA: Diagnosis not present

## 2022-06-24 DIAGNOSIS — R7303 Prediabetes: Secondary | ICD-10-CM | POA: Diagnosis not present

## 2022-07-08 DIAGNOSIS — I10 Essential (primary) hypertension: Secondary | ICD-10-CM | POA: Diagnosis not present

## 2022-07-08 DIAGNOSIS — R7303 Prediabetes: Secondary | ICD-10-CM | POA: Diagnosis not present

## 2022-07-16 DIAGNOSIS — R42 Dizziness and giddiness: Secondary | ICD-10-CM | POA: Diagnosis not present

## 2022-07-16 DIAGNOSIS — R0789 Other chest pain: Secondary | ICD-10-CM | POA: Diagnosis not present

## 2022-07-23 DIAGNOSIS — I10 Essential (primary) hypertension: Secondary | ICD-10-CM | POA: Diagnosis not present

## 2022-07-23 DIAGNOSIS — R7303 Prediabetes: Secondary | ICD-10-CM | POA: Diagnosis not present

## 2022-08-06 DIAGNOSIS — R7303 Prediabetes: Secondary | ICD-10-CM | POA: Diagnosis not present

## 2022-08-06 DIAGNOSIS — I10 Essential (primary) hypertension: Secondary | ICD-10-CM | POA: Diagnosis not present

## 2022-08-20 DIAGNOSIS — R7303 Prediabetes: Secondary | ICD-10-CM | POA: Diagnosis not present

## 2022-08-20 DIAGNOSIS — I10 Essential (primary) hypertension: Secondary | ICD-10-CM | POA: Diagnosis not present

## 2022-09-02 DIAGNOSIS — R7303 Prediabetes: Secondary | ICD-10-CM | POA: Diagnosis not present

## 2022-09-02 DIAGNOSIS — I10 Essential (primary) hypertension: Secondary | ICD-10-CM | POA: Diagnosis not present

## 2022-10-02 DIAGNOSIS — I10 Essential (primary) hypertension: Secondary | ICD-10-CM | POA: Diagnosis not present

## 2022-10-02 DIAGNOSIS — R7303 Prediabetes: Secondary | ICD-10-CM | POA: Diagnosis not present

## 2022-10-20 DIAGNOSIS — R7303 Prediabetes: Secondary | ICD-10-CM | POA: Diagnosis not present

## 2022-10-20 DIAGNOSIS — I10 Essential (primary) hypertension: Secondary | ICD-10-CM | POA: Diagnosis not present

## 2022-11-14 DIAGNOSIS — Z1231 Encounter for screening mammogram for malignant neoplasm of breast: Secondary | ICD-10-CM | POA: Diagnosis not present

## 2022-11-18 DIAGNOSIS — R7303 Prediabetes: Secondary | ICD-10-CM | POA: Diagnosis not present

## 2022-11-18 DIAGNOSIS — I10 Essential (primary) hypertension: Secondary | ICD-10-CM | POA: Diagnosis not present

## 2022-11-20 DIAGNOSIS — H5213 Myopia, bilateral: Secondary | ICD-10-CM | POA: Diagnosis not present

## 2022-11-20 DIAGNOSIS — H2513 Age-related nuclear cataract, bilateral: Secondary | ICD-10-CM | POA: Diagnosis not present

## 2022-11-20 DIAGNOSIS — H25013 Cortical age-related cataract, bilateral: Secondary | ICD-10-CM | POA: Diagnosis not present

## 2022-11-26 DIAGNOSIS — R7303 Prediabetes: Secondary | ICD-10-CM | POA: Diagnosis not present

## 2022-11-26 DIAGNOSIS — Z1321 Encounter for screening for nutritional disorder: Secondary | ICD-10-CM | POA: Diagnosis not present

## 2022-11-26 DIAGNOSIS — E782 Mixed hyperlipidemia: Secondary | ICD-10-CM | POA: Diagnosis not present

## 2022-12-04 ENCOUNTER — Other Ambulatory Visit (HOSPITAL_COMMUNITY): Payer: Self-pay | Admitting: Family Medicine

## 2022-12-04 DIAGNOSIS — R7303 Prediabetes: Secondary | ICD-10-CM | POA: Diagnosis not present

## 2022-12-04 DIAGNOSIS — M25612 Stiffness of left shoulder, not elsewhere classified: Secondary | ICD-10-CM | POA: Diagnosis not present

## 2022-12-04 DIAGNOSIS — I10 Essential (primary) hypertension: Secondary | ICD-10-CM | POA: Diagnosis not present

## 2022-12-04 DIAGNOSIS — E782 Mixed hyperlipidemia: Secondary | ICD-10-CM | POA: Diagnosis not present

## 2022-12-18 DIAGNOSIS — I10 Essential (primary) hypertension: Secondary | ICD-10-CM | POA: Diagnosis not present

## 2022-12-18 DIAGNOSIS — R7303 Prediabetes: Secondary | ICD-10-CM | POA: Diagnosis not present

## 2022-12-31 DIAGNOSIS — L858 Other specified epidermal thickening: Secondary | ICD-10-CM | POA: Diagnosis not present

## 2022-12-31 DIAGNOSIS — L239 Allergic contact dermatitis, unspecified cause: Secondary | ICD-10-CM | POA: Diagnosis not present

## 2022-12-31 DIAGNOSIS — L218 Other seborrheic dermatitis: Secondary | ICD-10-CM | POA: Diagnosis not present

## 2023-01-15 DIAGNOSIS — R7303 Prediabetes: Secondary | ICD-10-CM | POA: Diagnosis not present

## 2023-01-15 DIAGNOSIS — I10 Essential (primary) hypertension: Secondary | ICD-10-CM | POA: Diagnosis not present

## 2023-01-22 DIAGNOSIS — K5903 Drug induced constipation: Secondary | ICD-10-CM | POA: Diagnosis not present

## 2023-01-22 DIAGNOSIS — K625 Hemorrhage of anus and rectum: Secondary | ICD-10-CM | POA: Diagnosis not present

## 2023-01-22 DIAGNOSIS — K649 Unspecified hemorrhoids: Secondary | ICD-10-CM | POA: Diagnosis not present

## 2023-01-23 DIAGNOSIS — M25512 Pain in left shoulder: Secondary | ICD-10-CM | POA: Diagnosis not present

## 2023-02-19 DIAGNOSIS — I10 Essential (primary) hypertension: Secondary | ICD-10-CM | POA: Diagnosis not present

## 2023-02-19 DIAGNOSIS — K5900 Constipation, unspecified: Secondary | ICD-10-CM | POA: Diagnosis not present

## 2023-02-19 DIAGNOSIS — R7303 Prediabetes: Secondary | ICD-10-CM | POA: Diagnosis not present

## 2023-03-18 DIAGNOSIS — K5900 Constipation, unspecified: Secondary | ICD-10-CM | POA: Diagnosis not present

## 2023-03-18 DIAGNOSIS — R7303 Prediabetes: Secondary | ICD-10-CM | POA: Diagnosis not present

## 2023-03-18 DIAGNOSIS — I10 Essential (primary) hypertension: Secondary | ICD-10-CM | POA: Diagnosis not present

## 2023-04-16 DIAGNOSIS — I10 Essential (primary) hypertension: Secondary | ICD-10-CM | POA: Diagnosis not present

## 2023-04-16 DIAGNOSIS — K5900 Constipation, unspecified: Secondary | ICD-10-CM | POA: Diagnosis not present

## 2023-04-16 DIAGNOSIS — R7303 Prediabetes: Secondary | ICD-10-CM | POA: Diagnosis not present

## 2023-05-14 DIAGNOSIS — K5900 Constipation, unspecified: Secondary | ICD-10-CM | POA: Diagnosis not present

## 2023-05-14 DIAGNOSIS — I10 Essential (primary) hypertension: Secondary | ICD-10-CM | POA: Diagnosis not present

## 2023-05-14 DIAGNOSIS — R7303 Prediabetes: Secondary | ICD-10-CM | POA: Diagnosis not present

## 2023-06-05 DIAGNOSIS — E782 Mixed hyperlipidemia: Secondary | ICD-10-CM | POA: Diagnosis not present

## 2023-06-05 DIAGNOSIS — R7303 Prediabetes: Secondary | ICD-10-CM | POA: Diagnosis not present

## 2023-06-08 DIAGNOSIS — Z1389 Encounter for screening for other disorder: Secondary | ICD-10-CM | POA: Diagnosis not present

## 2023-06-08 DIAGNOSIS — Z0000 Encounter for general adult medical examination without abnormal findings: Secondary | ICD-10-CM | POA: Diagnosis not present

## 2023-06-09 DIAGNOSIS — I10 Essential (primary) hypertension: Secondary | ICD-10-CM | POA: Diagnosis not present

## 2023-06-09 DIAGNOSIS — E782 Mixed hyperlipidemia: Secondary | ICD-10-CM | POA: Diagnosis not present

## 2023-06-09 DIAGNOSIS — R7303 Prediabetes: Secondary | ICD-10-CM | POA: Diagnosis not present

## 2023-06-09 DIAGNOSIS — R809 Proteinuria, unspecified: Secondary | ICD-10-CM | POA: Diagnosis not present

## 2023-06-18 DIAGNOSIS — I10 Essential (primary) hypertension: Secondary | ICD-10-CM | POA: Diagnosis not present

## 2023-06-18 DIAGNOSIS — R7303 Prediabetes: Secondary | ICD-10-CM | POA: Diagnosis not present

## 2023-06-18 DIAGNOSIS — K5900 Constipation, unspecified: Secondary | ICD-10-CM | POA: Diagnosis not present

## 2023-07-01 DIAGNOSIS — M25552 Pain in left hip: Secondary | ICD-10-CM | POA: Diagnosis not present

## 2023-07-16 DIAGNOSIS — K5900 Constipation, unspecified: Secondary | ICD-10-CM | POA: Diagnosis not present

## 2023-07-16 DIAGNOSIS — R7303 Prediabetes: Secondary | ICD-10-CM | POA: Diagnosis not present

## 2023-07-16 DIAGNOSIS — I10 Essential (primary) hypertension: Secondary | ICD-10-CM | POA: Diagnosis not present

## 2023-08-20 DIAGNOSIS — I10 Essential (primary) hypertension: Secondary | ICD-10-CM | POA: Diagnosis not present

## 2023-08-20 DIAGNOSIS — K5900 Constipation, unspecified: Secondary | ICD-10-CM | POA: Diagnosis not present

## 2023-08-20 DIAGNOSIS — R7303 Prediabetes: Secondary | ICD-10-CM | POA: Diagnosis not present

## 2023-09-17 DIAGNOSIS — G4452 New daily persistent headache (NDPH): Secondary | ICD-10-CM | POA: Diagnosis not present

## 2023-09-18 DIAGNOSIS — H2513 Age-related nuclear cataract, bilateral: Secondary | ICD-10-CM | POA: Diagnosis not present

## 2023-09-18 DIAGNOSIS — R519 Headache, unspecified: Secondary | ICD-10-CM | POA: Diagnosis not present

## 2023-09-23 ENCOUNTER — Other Ambulatory Visit (HOSPITAL_COMMUNITY): Payer: Self-pay | Admitting: Family Medicine

## 2023-09-23 DIAGNOSIS — R42 Dizziness and giddiness: Secondary | ICD-10-CM

## 2023-09-23 DIAGNOSIS — G4452 New daily persistent headache (NDPH): Secondary | ICD-10-CM

## 2023-09-23 DIAGNOSIS — H93A9 Pulsatile tinnitus, unspecified ear: Secondary | ICD-10-CM

## 2023-09-23 DIAGNOSIS — H539 Unspecified visual disturbance: Secondary | ICD-10-CM

## 2023-09-24 ENCOUNTER — Other Ambulatory Visit (HOSPITAL_COMMUNITY): Payer: Self-pay | Admitting: Family Medicine

## 2023-09-24 DIAGNOSIS — R42 Dizziness and giddiness: Secondary | ICD-10-CM

## 2023-09-24 DIAGNOSIS — H539 Unspecified visual disturbance: Secondary | ICD-10-CM

## 2023-09-24 DIAGNOSIS — G4452 New daily persistent headache (NDPH): Secondary | ICD-10-CM

## 2023-09-24 DIAGNOSIS — H93A9 Pulsatile tinnitus, unspecified ear: Secondary | ICD-10-CM

## 2023-09-26 ENCOUNTER — Ambulatory Visit (HOSPITAL_COMMUNITY)
Admission: RE | Admit: 2023-09-26 | Discharge: 2023-09-26 | Disposition: A | Discharge status: Home / Self Care | Payer: Medicare Other | Source: Ambulatory Visit | Attending: Family Medicine | Admitting: Family Medicine

## 2023-09-26 ENCOUNTER — Encounter (HOSPITAL_COMMUNITY): Payer: Self-pay

## 2023-09-26 DIAGNOSIS — H93A9 Pulsatile tinnitus, unspecified ear: Secondary | ICD-10-CM | POA: Insufficient documentation

## 2023-09-26 DIAGNOSIS — G4452 New daily persistent headache (NDPH): Secondary | ICD-10-CM

## 2023-09-26 DIAGNOSIS — R42 Dizziness and giddiness: Secondary | ICD-10-CM

## 2023-09-26 DIAGNOSIS — H539 Unspecified visual disturbance: Secondary | ICD-10-CM | POA: Insufficient documentation

## 2023-09-26 MED ORDER — GADOBUTROL 1 MMOL/ML IV SOLN
8.0000 mL | Freq: Once | INTRAVENOUS | Status: AC | PRN
  Administered 2023-09-26: 8 mL via INTRAVENOUS

## 2023-09-27 ENCOUNTER — Ambulatory Visit (HOSPITAL_COMMUNITY): Payer: Medicare Other

## 2023-10-15 DIAGNOSIS — K5900 Constipation, unspecified: Secondary | ICD-10-CM | POA: Diagnosis not present

## 2023-10-15 DIAGNOSIS — I10 Essential (primary) hypertension: Secondary | ICD-10-CM | POA: Diagnosis not present

## 2023-10-15 DIAGNOSIS — R7303 Prediabetes: Secondary | ICD-10-CM | POA: Diagnosis not present

## 2023-11-19 DIAGNOSIS — R7303 Prediabetes: Secondary | ICD-10-CM | POA: Diagnosis not present

## 2023-11-19 DIAGNOSIS — K5900 Constipation, unspecified: Secondary | ICD-10-CM | POA: Diagnosis not present

## 2023-11-19 DIAGNOSIS — I10 Essential (primary) hypertension: Secondary | ICD-10-CM | POA: Diagnosis not present

## 2023-11-20 DIAGNOSIS — Z1231 Encounter for screening mammogram for malignant neoplasm of breast: Secondary | ICD-10-CM | POA: Diagnosis not present

## 2023-12-02 DIAGNOSIS — H2513 Age-related nuclear cataract, bilateral: Secondary | ICD-10-CM | POA: Diagnosis not present

## 2023-12-02 DIAGNOSIS — H5211 Myopia, right eye: Secondary | ICD-10-CM | POA: Diagnosis not present

## 2023-12-02 DIAGNOSIS — H25013 Cortical age-related cataract, bilateral: Secondary | ICD-10-CM | POA: Diagnosis not present

## 2023-12-04 DIAGNOSIS — R7303 Prediabetes: Secondary | ICD-10-CM | POA: Diagnosis not present

## 2023-12-15 DIAGNOSIS — E782 Mixed hyperlipidemia: Secondary | ICD-10-CM | POA: Diagnosis not present

## 2023-12-15 DIAGNOSIS — R809 Proteinuria, unspecified: Secondary | ICD-10-CM | POA: Diagnosis not present

## 2023-12-15 DIAGNOSIS — I10 Essential (primary) hypertension: Secondary | ICD-10-CM | POA: Diagnosis not present

## 2023-12-15 DIAGNOSIS — R7303 Prediabetes: Secondary | ICD-10-CM | POA: Diagnosis not present

## 2023-12-24 DIAGNOSIS — R7303 Prediabetes: Secondary | ICD-10-CM | POA: Diagnosis not present

## 2023-12-24 DIAGNOSIS — I10 Essential (primary) hypertension: Secondary | ICD-10-CM | POA: Diagnosis not present

## 2023-12-24 DIAGNOSIS — K5900 Constipation, unspecified: Secondary | ICD-10-CM | POA: Diagnosis not present

## 2024-01-21 DIAGNOSIS — R7303 Prediabetes: Secondary | ICD-10-CM | POA: Diagnosis not present

## 2024-01-21 DIAGNOSIS — I10 Essential (primary) hypertension: Secondary | ICD-10-CM | POA: Diagnosis not present

## 2024-01-21 DIAGNOSIS — K5900 Constipation, unspecified: Secondary | ICD-10-CM | POA: Diagnosis not present

## 2024-02-01 ENCOUNTER — Encounter: Payer: Self-pay | Admitting: Neurology

## 2024-02-01 ENCOUNTER — Ambulatory Visit: Admitting: Neurology

## 2024-02-01 VITALS — BP 115/70 | HR 76 | Ht 61.0 in | Wt 173.0 lb

## 2024-02-01 DIAGNOSIS — H53121 Transient visual loss, right eye: Secondary | ICD-10-CM

## 2024-02-01 DIAGNOSIS — H93A9 Pulsatile tinnitus, unspecified ear: Secondary | ICD-10-CM

## 2024-02-01 DIAGNOSIS — R519 Headache, unspecified: Secondary | ICD-10-CM

## 2024-02-01 DIAGNOSIS — D329 Benign neoplasm of meninges, unspecified: Secondary | ICD-10-CM

## 2024-02-01 DIAGNOSIS — H5460 Unqualified visual loss, one eye, unspecified: Secondary | ICD-10-CM | POA: Diagnosis not present

## 2024-02-01 DIAGNOSIS — G441 Vascular headache, not elsewhere classified: Secondary | ICD-10-CM

## 2024-02-01 NOTE — Patient Instructions (Addendum)
 MRI of the orbitsMeningioma now Repeat in 6 months   A meningioma is an growth of cells  that occurs in the meninges. The meninges are the thin tissues that cover the brain and spinal cord. A meningioma is almost always not cancer (benign). In rare cases, a meningioma is cancerous (malignant) or fast growing.  What are the causes? This condition may be caused by: Being exposed to ionizing radiation. This type of energy occurs naturally in the environment. It also comes from artificial sources, such as from X-rays and some medical devices. Neurofibromatosis 2. This is a genetic disorder that causes many soft tumors. Genetic mutations. These are changes in certain genes. Many times, the cause of this condition is not known. What increases the risk? The following factors may make you more likely to develop this condition: Being exposed to radiation, especially as a child. Being female. There may be a greater risk linked to having female hormones. Older people who are female have a higher risk of meningiomas than people who are female or children. People who are female have a higher risk of malignant meningiomas. Being obese. What are the signs or symptoms? Common symptoms of this condition include: Headaches. Nausea and vomiting. Vision changes. Hearing changes. Loss of the sense of smell. Other symptoms include: Seizures. Weakness or numbness on one side of the body, or in an arm or a leg. Problems with memory or thinking. Mood or personality changes. Symptoms of this condition usually begin very slowly. The symptoms may depend on the size and location of your tumor. How is this diagnosed? This condition is diagnosed based on: Results of brain imaging tests, such as a CT scan or an MRI. Removing a tissue sample to look at it under a microscope (biopsy). This may be done to confirm the diagnosis and to help determine the best treatment for your condition. How is this treated? This  condition may be treated with: Medicines, such as steroids. These may help lessen brain swelling and and improve your symptoms. Radiation therapy. This uses high-energy rays to shrink or kill your tumor. Surgery to remove as much of your tumor as possible. You may not have treatment until your symptoms start to affect your daily activities. This is because meningiomas grow very slowly. Your health care provider may prefer to watch for growth of your benign tumor before starting treatment. Follow these instructions at home: Take over-the-counter and prescription medicines only as told by your health care provider. Follow instructions from your health care provider about what you may eat and drink. Drink enough fluid to keep your urine pale yellow. Return to your normal activities as told by your health care provider. Ask your health care provider what activities are safe for you. Keep all follow-up visits. You may need regular visits with your health care provider to watch the growth of your tumor. Contact a health care provider if: You have symptoms that come back after treatment. You have headaches. You vomit. You have changes in your vision. You are weaker or more tired than usual. Get help right away if: You have sudden changes in vision. You have a seizure or you are told you have had a seizure. You have new weakness or numbness on one side of your body. Your vomiting does not go away, or you cannot eat or drink without vomiting. You have trouble walking. This information is not intended to replace advice given to you by your health care provider. Make sure you discuss any  questions you have with your health care provider. Document Revised: 12/02/2021 Document Reviewed: 12/02/2021 Elsevier Patient Education  2024 [employer].

## 2024-02-01 NOTE — Progress Notes (Signed)
 [facility]    Provider:  Dr Ines Requesting Provider: Cleotilde Planas, MD Primary Care Provider:  Cyrena Gwenn SQUIBB, MD (Inactive)  CC:  Headache  HPI:  Autumn Hamilton is a 73 y.o. female here as requested by Cleotilde Planas, MD for headache.  I reviewed Charmaine Bright, PA's notes from date of service February 2025 at which time she complained of 2 weeks worth of a new onset headache, intermittent no clear trigger, right sided associated with a whooshing sensation with her heartbeat, like there is an ocean in her ear, also admitted to progressive blurring of her vision over the last few months possibly gotten more intense over the last 2 weeks but is unsure, she has a history of cataracts, she denied any photophobia, flashing lights, vision loss, phonophobia, nausea or vomiting, she has a history of tinnitus no vertigo or balance issues, imaging was ordered, MRA of the head showed 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  Patient states she had a right-sided headache, thinks it was sinuses, comes and goes, has congestion and coughs at night, took metamucil, she has darainage, she has always taken anti histamine, feels like fluid in the ears, hedache started n February, she had facial pain when pushing down on the sinuses, the headache is resolved but she feels congestion and fullness on the right side, not in the ears though. A few years ago she had some vertigo but nothing recently. She has had vision changes in the right eye but was also told she ha a cataract there which may be the cause or either eye. She went to the eye doctor and everything was fine. She also went to the dentist to see if she has a dental problem. Currently she can palpate the maxillary sinus and feels tender.   Patient states she is here to discuss MR results. 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 No definite involvement of the orbital apex. Reviewed images personally and with patient and agree with findings, answered questions. Meningioma not likely causing symptoms, would reimage at this time and then in a year for stability.   Reviewed notes, labs and imaging from outside physicians, which showed:  MRI brain 09/2023: reviewed images and agree with the following: Brain: There is no acute infarction or intracranial hemorrhage. There is nodular extra-axial enhancement along the right anterior clinoid process measuring up to 1 cm craniocaudal (series 17, image 21). No definite encroachment on the orbital apex. No mass effect or edema. There is no hydrocephalus or extra-axial fluid collection. Ventricles and sulci are normal in size and configuration. 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 No definite involvement of the orbital apex.  Review of Systems: Patient complains of symptoms per HPI as well as the following symptoms none. Pertinent negatives and positives per HPI. All others negative.   Social History   Socioeconomic History   Marital status: Married    Spouse name: Not on file   Number of children: 0   Years of education: Not on file   Highest education level: Not on file  Occupational History   Not on file  Tobacco Use   Smoking status: Never   Smokeless tobacco: Never  Vaping Use   Vaping status: Never Used  Substance and Sexual Activity  Alcohol use: No   Drug use: No   Sexual activity: Not on file  Other Topics Concern   Not on file  Social History Narrative   Pt lives with family    Retired    [employer] Strain: Not on file  Food Insecurity: Not on file  Transportation Needs: Not on file  Physical Activity: Not on file  Stress: Not on  file  Social Connections: Not on file  Intimate Partner Violence: Not on file    Family History  Problem Relation Age of Onset   Hypertension Mother    Heart disease Father    Hypertension Sister    Hypertension Sister    Hypertension Sister    Hypertension Sister     Past Medical History:  Diagnosis Date   Arthritis    Hypertension    Primary localized osteoarthritis of knee    Right    Patient Active Problem List   Diagnosis Date Noted   Chest pain 06/21/2019   Primary osteoarthritis of knee 01/12/2018   Primary osteoarthritis of right knee 12/21/2017   Primary osteoarthritis of left knee 07/27/2017   Essential hypertension 07/27/2017    Past Surgical History:  Procedure Laterality Date   ABDOMINAL HYSTERECTOMY     bicep tear Left    JOINT REPLACEMENT     TONSILLECTOMY     torn meniscus Bilateral    TOTAL KNEE ARTHROPLASTY Left    TOTAL KNEE ARTHROPLASTY Left 08/25/2017   Procedure: TOTAL KNEE ARTHROPLASTY;  Surgeon: Beverley Evalene BIRCH, MD;  Location: [facility] OR;  Service: Orthopedics;  Laterality: Left;   TOTAL KNEE ARTHROPLASTY Right 01/12/2018   TOTAL KNEE ARTHROPLASTY Right 01/12/2018   Procedure: RIGHT TOTAL KNEE ARTHROPLASTY;  Surgeon: Beverley Evalene BIRCH, MD;  Location: [facility] OR;  Service: Orthopedics;  Laterality: Right;    Current Outpatient Medications  Medication Sig Dispense Refill   Ascorbic Acid (VITAMIN C ER PO) Take by mouth daily.     aspirin  EC 81 MG tablet Take 1 tablet (81 mg total) by mouth 2 (two) times daily. For DVT prophylaxis (Patient taking differently: Take 81 mg by mouth daily. For DVT prophylaxis) 60 tablet 0   Biotin 5000 MCG TABS Take 1 tablet by mouth daily.     ECHINACEA PO Take by mouth daily.     eplerenone (INSPRA) 50 MG tablet Take 100 mg by mouth daily.     Glucos-Chond-Hyal Ac-Ca Fructo (MOVE FREE JOINT HEALTH ADVANCE PO) Take by mouth daily.     loratadine (KLS ALLERCLEAR) 10 MG tablet Take 10 mg by mouth daily.     Methylcellulose,  Laxative, (CITRUCEL PO) Take by mouth daily.     metoprolol  tartrate (LOPRESSOR ) 25 MG tablet Take 1 tablet (25 mg total) by mouth 2 (two) times daily. 10 tablet 0   Multiple Vitamins-Minerals (MULTIVITAMIN WITH MINERALS) tablet Take 1 tablet by mouth daily.     pravastatin  (PRAVACHOL ) 20 MG tablet TAKE 1 TABLET BY MOUTH EVERY DAY 90 tablet 1   verapamil  (CALAN -SR) 240 MG CR tablet TAKE 1/2 TABLET BY MOUTH EVERY DAY 30 tablet 0   Zinc Sulfate (ZINC-220 PO) Take by mouth daily.     No current facility-administered medications for this visit.    Allergies as of 02/01/2024   (No Known Allergies)    Vitals: BP 115/70   Pulse 76   Ht 5' 1 (1.549 m)   Wt 173 lb (78.5 kg)   BMI 32.69 kg/m  Last Weight:  Wt Readings from Last 1 Encounters:  02/01/24 173 lb (78.5 kg)   Last Height:   Ht Readings from Last 1 Encounters:  02/01/24 5' 1 (1.549 m)     Physical exam: Exam: Gen: NAD, conversant, well nourised, well groomed                     CV: RRR, no MRG. No Carotid Bruits. No peripheral edema, warm, nontender Eyes: Conjunctivae clear without exudates or hemorrhage  Neuro: Detailed Neurologic Exam  Speech:    Speech is normal; fluent and spontaneous with normal comprehension.  Cognition:    The patient is oriented to person, place, and time;     recent and remote memory intact;     language fluent;     normal attention, concentration,     fund of knowledge Cranial Nerves:    The pupils are equal, round, and reactive to light. The fundi are normal and spontaneous venous pulsations are present. Visual fields are full to finger confrontation. Extraocular movements are intact. Trigeminal sensation is intact and the muscles of mastication are normal. The face is symmetric. The palate elevates in the midline. Hearing intact. Voice is normal. Shoulder shrug is normal. The tongue has normal motion without fasciculations.   Coordination: nml  Gait: nml  Motor Observation:     No asymmetry, no atrophy, and no involuntary movements noted. Tone:    Normal muscle tone.    Posture:    Posture is normal. normal erect    Strength:    Strength is V/V in the upper and lower limbs.      Sensation: intact to LT     Reflex Exam:  DTR's:    Deep tendon reflexes in the upper and lower extremities are symmetrical bilaterally.   Toes:    The toes are downgoing bilaterally.   Clonus:    Clonus is absent.    Assessment/Plan:  Lovely patient who looks much younger than stated age with right-sided headache and vision changes. MRI found a 1 cm right anterior clinoid process lesion possibly meningioma  which is likely incidental but given that the possibly meningioma(vs other type ot lesion) borders the optic nerve will repeat for stability and add MR orbits for thin cuts.  If stable repeat in 6 months F/u one year If patient has worsening symptoms recommend presenting to the ED or calling 911 May be sinus related headache(tenderness to palpation of the maxillary right sinus), advised try flonase, netti pot, may see an allergist in the future Pcp checked esr and no other signs of gca, no jaw tenderness, no fever, headache not worsening in several months  Orders Placed This Encounter  Procedures   MR BRAIN W WO CONTRAST   MR ORBITS W WO CONTRAST    Cc: Cleotilde Planas, MD,  Cyrena Gwenn SQUIBB, MD (Inactive)  Onetha Epp, MD  [facility] [address] [place], [place] [address]  Phone [phone] Fax [phone]

## 2024-02-02 ENCOUNTER — Telehealth: Payer: Self-pay | Admitting: Neurology

## 2024-02-02 NOTE — Telephone Encounter (Signed)
 no auth required sent to GI [phone]

## 2024-02-16 DIAGNOSIS — M7989 Other specified soft tissue disorders: Secondary | ICD-10-CM | POA: Diagnosis not present

## 2024-02-18 DIAGNOSIS — K5900 Constipation, unspecified: Secondary | ICD-10-CM | POA: Diagnosis not present

## 2024-02-18 DIAGNOSIS — I10 Essential (primary) hypertension: Secondary | ICD-10-CM | POA: Diagnosis not present

## 2024-02-18 DIAGNOSIS — R7303 Prediabetes: Secondary | ICD-10-CM | POA: Diagnosis not present

## 2024-03-07 ENCOUNTER — Ambulatory Visit: Payer: Self-pay | Admitting: Neurology

## 2024-03-07 ENCOUNTER — Ambulatory Visit
Admission: RE | Admit: 2024-03-07 | Discharge: 2024-03-07 | Disposition: A | Discharge status: Home / Self Care | Source: Ambulatory Visit | Attending: Neurology | Admitting: Neurology

## 2024-03-07 ENCOUNTER — Ambulatory Visit
Admission: RE | Admit: 2024-03-07 | Discharge: 2024-03-07 | Disposition: A | Discharge status: Home / Self Care | Source: Ambulatory Visit | Attending: Neurology

## 2024-03-07 DIAGNOSIS — D329 Benign neoplasm of meninges, unspecified: Secondary | ICD-10-CM | POA: Diagnosis not present

## 2024-03-07 DIAGNOSIS — R519 Headache, unspecified: Secondary | ICD-10-CM | POA: Diagnosis not present

## 2024-03-07 DIAGNOSIS — H5460 Unqualified visual loss, one eye, unspecified: Secondary | ICD-10-CM | POA: Diagnosis not present

## 2024-03-07 DIAGNOSIS — H53121 Transient visual loss, right eye: Secondary | ICD-10-CM | POA: Diagnosis not present

## 2024-03-07 DIAGNOSIS — H5461 Unqualified visual loss, right eye, normal vision left eye: Secondary | ICD-10-CM | POA: Diagnosis not present

## 2024-03-07 DIAGNOSIS — G441 Vascular headache, not elsewhere classified: Secondary | ICD-10-CM | POA: Diagnosis not present

## 2024-03-07 MED ORDER — GADOPICLENOL 0.5 MMOL/ML IV SOLN
8.0000 mL | Freq: Once | INTRAVENOUS | Status: AC | PRN
  Administered 2024-03-07: 8 mL via INTRAVENOUS

## 2024-03-24 DIAGNOSIS — R7303 Prediabetes: Secondary | ICD-10-CM | POA: Diagnosis not present

## 2024-03-24 DIAGNOSIS — K5900 Constipation, unspecified: Secondary | ICD-10-CM | POA: Diagnosis not present

## 2024-03-24 DIAGNOSIS — I10 Essential (primary) hypertension: Secondary | ICD-10-CM | POA: Diagnosis not present

## 2024-04-27 DIAGNOSIS — R7303 Prediabetes: Secondary | ICD-10-CM | POA: Diagnosis not present

## 2024-04-27 DIAGNOSIS — K5900 Constipation, unspecified: Secondary | ICD-10-CM | POA: Diagnosis not present

## 2024-04-27 DIAGNOSIS — I10 Essential (primary) hypertension: Secondary | ICD-10-CM | POA: Diagnosis not present

## 2024-05-16 DIAGNOSIS — M1612 Unilateral primary osteoarthritis, left hip: Secondary | ICD-10-CM | POA: Diagnosis not present

## 2024-05-26 DIAGNOSIS — R7303 Prediabetes: Secondary | ICD-10-CM | POA: Diagnosis not present

## 2024-05-26 DIAGNOSIS — K5900 Constipation, unspecified: Secondary | ICD-10-CM | POA: Diagnosis not present

## 2024-05-26 DIAGNOSIS — I10 Essential (primary) hypertension: Secondary | ICD-10-CM | POA: Diagnosis not present

## 2024-05-27 DIAGNOSIS — H8113 Benign paroxysmal vertigo, bilateral: Secondary | ICD-10-CM | POA: Diagnosis not present

## 2024-05-27 DIAGNOSIS — R0981 Nasal congestion: Secondary | ICD-10-CM | POA: Diagnosis not present

## 2024-05-27 DIAGNOSIS — R519 Headache, unspecified: Secondary | ICD-10-CM | POA: Diagnosis not present

## 2024-05-27 DIAGNOSIS — R0789 Other chest pain: Secondary | ICD-10-CM | POA: Diagnosis not present

## 2024-06-07 DIAGNOSIS — I10 Essential (primary) hypertension: Secondary | ICD-10-CM | POA: Diagnosis not present

## 2024-06-07 DIAGNOSIS — E782 Mixed hyperlipidemia: Secondary | ICD-10-CM | POA: Diagnosis not present

## 2024-06-07 DIAGNOSIS — R7303 Prediabetes: Secondary | ICD-10-CM | POA: Diagnosis not present

## 2024-06-09 DIAGNOSIS — Z0000 Encounter for general adult medical examination without abnormal findings: Secondary | ICD-10-CM | POA: Diagnosis not present

## 2024-06-09 DIAGNOSIS — R7303 Prediabetes: Secondary | ICD-10-CM | POA: Diagnosis not present

## 2024-06-09 DIAGNOSIS — M8580 Other specified disorders of bone density and structure, unspecified site: Secondary | ICD-10-CM | POA: Diagnosis not present

## 2024-06-09 DIAGNOSIS — Z780 Asymptomatic menopausal state: Secondary | ICD-10-CM | POA: Diagnosis not present

## 2024-06-09 DIAGNOSIS — Z23 Encounter for immunization: Secondary | ICD-10-CM | POA: Diagnosis not present

## 2024-06-09 DIAGNOSIS — E7800 Pure hypercholesterolemia, unspecified: Secondary | ICD-10-CM | POA: Diagnosis not present

## 2024-06-09 DIAGNOSIS — H9313 Tinnitus, bilateral: Secondary | ICD-10-CM | POA: Diagnosis not present

## 2024-06-09 DIAGNOSIS — I10 Essential (primary) hypertension: Secondary | ICD-10-CM | POA: Diagnosis not present

## 2024-08-30 ENCOUNTER — Telehealth: Payer: Self-pay | Admitting: *Deleted

## 2024-08-30 NOTE — Telephone Encounter (Signed)
 Received a neurology preoperative clearance form from [facility] :    for L total hip replacement.   Dr. Vear is reassigned provider.  Needs sooner appt, once receive clearance then surgery to be scheduled.

## 2024-08-31 NOTE — Telephone Encounter (Signed)
 I called pt and made appt with Jessica Mccue, NP for surgical clearance for tomorrow 09-01-2024 at 1345. Arrive 1:30pm.  This was ok per Dr. Vear to see NP.

## 2024-08-31 NOTE — Telephone Encounter (Signed)
 Please determine what they are requesting us  to clear her for. She is followed here for migraine headaches.

## 2024-09-01 ENCOUNTER — Telehealth: Payer: Self-pay

## 2024-09-01 ENCOUNTER — Ambulatory Visit: Admitting: Adult Health

## 2024-09-01 ENCOUNTER — Encounter: Payer: Self-pay | Admitting: Adult Health

## 2024-09-01 VITALS — BP 129/80 | HR 78 | Ht 61.0 in

## 2024-09-01 DIAGNOSIS — Z01818 Encounter for other preprocedural examination: Secondary | ICD-10-CM

## 2024-09-01 DIAGNOSIS — D329 Benign neoplasm of meninges, unspecified: Secondary | ICD-10-CM

## 2024-09-01 NOTE — Progress Notes (Signed)
 " [facility] [address] [place] [facility] [place]. [place] [address] [phone]       OFFICE FOLLOW UP NOTE  Ms. [name] Date of Birth:  03/03/1951 Medical Record Number:  [id number]     Reason for visit: Surgical clearance    SUBJECTIVE:  CHIEF COMPLAINT:  Chief Complaint  Patient presents with   Follow-up    Patient in room 9 alone.  Patient is here for surgical clearance, patient states she's currently stable that things are well on medication.     Follow-up visit:  Prior visit: 02/01/2024 with Dr. Ines  Brief HPI:   Autumn Hamilton is a 74 y.o. female who was evaluated by Dr. Ines for headache and abnormal MRI.  Per PCP note, onset of new headache in 09/2023, right sided with pulsatile tinnitus and progressive blurring of vision.  No associated photophobia, phonophobia or N/V.  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  Per Dr. Ines, patient felt more sinus related, noted resolution of headaches upon initial evaluation but still congestion and fullness on right side.  Suspected possible meningioma incidental finding and asymptomatic, recommended repeat imaging to ensure stability.  Completed imaging 02/2024 with MRI orbits showing 1 cm right paraclinoid meningioma and MRI brain showing stable appearance of 1 cm right paraclinoid meningioma without significant changes from 09/2023 imaging.      Interval history:  Patient returns today requesting surgical clearance to pursue left hip surgery.  Orthopedics is requesting clearance to ensure stability of meningioma.  Surgery is not yet scheduled.  She has been doing well without any persistent headaches, vision changes or cranial nerve palsies.  She does report a mild right-sided headache today but believes more sinus related and from barometric pressure change.  She has no questions or concerns at this time.  She does agree to repeat imaging to ensure  stability of meningioma and if this is stable, clearance form will be completed and can proceed with surgical procedure.  She verbalized understanding.        ROS:   14 system review of systems performed and negative with exception of those listed in HPI  PMH:  Past Medical History:  Diagnosis Date   Arthritis    Hypertension    Primary localized osteoarthritis of knee    Right    PSH:  Past Surgical History:  Procedure Laterality Date   ABDOMINAL HYSTERECTOMY     bicep tear Left    JOINT REPLACEMENT     TONSILLECTOMY     torn meniscus Bilateral    TOTAL KNEE ARTHROPLASTY Left    TOTAL KNEE ARTHROPLASTY Left 08/25/2017   Procedure: TOTAL KNEE ARTHROPLASTY;  Surgeon: Beverley Evalene BIRCH, MD;  Location: [facility] OR;  Service: Orthopedics;  Laterality: Left;   TOTAL KNEE ARTHROPLASTY Right 01/12/2018   TOTAL KNEE ARTHROPLASTY Right 01/12/2018   Procedure: RIGHT TOTAL KNEE ARTHROPLASTY;  Surgeon: Beverley Evalene BIRCH, MD;  Location: [facility] OR;  Service: Orthopedics;  Laterality: Right;    Social History:  Social History   Socioeconomic History   Marital status: Married    Spouse name: Not on file   Number of children: 0   Years of education: Not on file   Highest education level: Not on file  Occupational History   Not on file  Tobacco Use   Smoking status: Never   Smokeless tobacco: Never  Vaping Use   Vaping status: Never Used  Substance and Sexual Activity  Alcohol use: No   Drug use: No   Sexual activity: Not on file  Other Topics Concern   Not on file  Social History Narrative   Pt lives with family    Retired    Social Drivers of Health   Tobacco Use: Low Risk (09/01/2024)   Patient History    Smoking Tobacco Use: Never    Smokeless Tobacco Use: Never    Passive Exposure: Not on file  Financial Resource Strain: Not on file  Food Insecurity: Not on file  Transportation Needs: Not on file  Physical Activity: Not on file  Stress: Not on file  Social Connections:  Not on file  Intimate Partner Violence: Not on file  Depression (EYV7-0): Not on file  Alcohol Screen: Not on file  Housing: Not on file  Utilities: Not on file  Health Literacy: Not on file    Family History:  Family History  Problem Relation Age of Onset   Hypertension Mother    Heart disease Father    Hypertension Sister    Hypertension Sister    Hypertension Sister    Hypertension Sister     Medications:  Medications Ordered Prior to Encounter[1]  Allergies:  Allergies[2]    OBJECTIVE:  Physical Exam  Vitals:   09/01/24 1335  BP: 129/80  Pulse: 78  Height: 5' 1 (1.549 m)   Body mass index is 32.69 kg/m. No results found.   General: well developed, well nourished, very pleasant elderly African female, seated, in no evident distress  Neurologic Exam Mental Status: Awake and fully alert. Oriented to place and time. Recent and remote memory intact. Attention span, concentration and fund of knowledge appropriate. Mood and affect appropriate.  Cranial Nerves: Pupils equal, briskly reactive to light. Extraocular movements full without nystagmus. Visual fields full to confrontation. Hearing intact. Facial sensation intact. Face, tongue, palate moves normally and symmetrically.  Motor: Normal bulk and tone. Normal strength in all tested extremity muscles Sensory.: intact to touch , pinprick , position and vibratory sensation.  Coordination: Rapid alternating movements normal in all extremities. Finger-to-nose and heel-to-shin performed accurately bilaterally. Gait and Station: Arises from chair without difficulty. Stance is normal. Gait demonstrates antalgic gait with mild favoring of left hip.           ASSESSMENT/PLAN: Autumn Hamilton is a 74 y.o. year old female      Right paraclinoid meningioma:  Surgical clearance: Repeat MRI brain w wo contrast for surveillance monitoring MRI brain w wo contrast 02/2024 stable 1 cm right paraclinoid meningioma If  this is stable, will be cleared to proceed with left hip replacement procedure She verbalized understanding and agrees with plan Advised to call with any new or recurrent symptoms     Follow up in with Dr. Vear to establish care or call earlier if needed   CC:  PCP: Cyrena Gwenn SQUIBB, MD (Inactive)    I personally spent a total of 25 minutes in the care of the patient today including preparing to see the patient, getting/reviewing separately obtained history, performing a medically appropriate exam/evaluation, counseling and educating, placing orders, and documenting clinical information in the EHR. This is our first time meeting and time has been spent reviewing past medical history and relevant medical records.   Harlene Bogaert, AGNP-BC  [facility] [address] [place], [place] [address]  Phone [phone] Fax [phone] Note: This document was prepared with digital dictation and possible smart phrase technology. Any transcriptional errors that result  from this process are unintentional.     [1]  Current Outpatient Medications on File Prior to Visit  Medication Sig Dispense Refill   Ascorbic Acid (VITAMIN C ER PO) Take by mouth daily.     aspirin  EC 81 MG tablet Take 1 tablet (81 mg total) by mouth 2 (two) times daily. For DVT prophylaxis 60 tablet 0   Biotin 5000 MCG TABS Take 1 tablet by mouth daily.     eplerenone (INSPRA) 50 MG tablet Take 100 mg by mouth daily.     Glucos-Chond-Hyal Ac-Ca Fructo (MOVE FREE JOINT HEALTH ADVANCE PO) Take by mouth daily.     loratadine (KLS ALLERCLEAR) 10 MG tablet Take 10 mg by mouth daily.     rosuvastatin  (CRESTOR ) 10 MG tablet Take 10 mg by mouth daily.     Semaglutide, 2 MG/DOSE, (OZEMPIC, 2 MG/DOSE,) 8 MG/3ML SOPN as directed Subcutaneous weely; Duration: 30 days     verapamil  (CALAN -SR) 240 MG CR tablet TAKE 1/2 TABLET BY MOUTH EVERY DAY 30 tablet 0   ECHINACEA PO Take by mouth daily.      Methylcellulose, Laxative, (CITRUCEL PO) Take by mouth daily.     metoprolol  tartrate (LOPRESSOR ) 25 MG tablet Take 1 tablet (25 mg total) by mouth 2 (two) times daily. 10 tablet 0   Multiple Vitamins-Minerals (MULTIVITAMIN WITH MINERALS) tablet Take 1 tablet by mouth daily.     pravastatin  (PRAVACHOL ) 20 MG tablet TAKE 1 TABLET BY MOUTH EVERY DAY (Patient not taking: Reported on 09/01/2024) 90 tablet 1   Zinc Sulfate (ZINC-220 PO) Take by mouth daily.     No current facility-administered medications on file prior to visit.  [2] No Known Allergies  "

## 2024-09-01 NOTE — Telephone Encounter (Signed)
 Surgical clearance form placed on Jessica's desk.

## 2024-09-01 NOTE — Patient Instructions (Signed)
 Your Plan:  You will be called to schedule a repeat MRI Brain. After this is resulted and if it looks good, you will be cleared to pursue surgery     Follow up in 1 year or call earlier if needed     Thank you for coming to see us  at [facility]. I hope we have been able to provide you high quality care today.  You may receive a patient satisfaction survey over the next few weeks. We would appreciate your feedback and comments so that we may continue to improve ourselves and the health of our patients.

## 2024-09-13 ENCOUNTER — Ambulatory Visit
Admission: RE | Admit: 2024-09-13 | Discharge: 2024-09-13 | Disposition: A | Source: Ambulatory Visit | Attending: Adult Health

## 2024-09-13 DIAGNOSIS — D329 Benign neoplasm of meninges, unspecified: Secondary | ICD-10-CM

## 2024-09-13 MED ORDER — GADOPICLENOL 0.5 MMOL/ML IV SOLN
8.0000 mL | Freq: Once | INTRAVENOUS | Status: AC | PRN
  Administered 2024-09-13: 8 mL via INTRAVENOUS

## 2024-09-14 ENCOUNTER — Ambulatory Visit: Payer: Self-pay | Admitting: Adult Health

## 2024-09-14 NOTE — Telephone Encounter (Signed)
 Repeat MRI brain showed stable appearance of meningioma compared to prior imaging.  She is cleared to proceed with left total hip replacement from a neurological standpoint.  Form completed and provided back to POD 3. Please send clearance form back to [facility] so patient can be scheduled for procedure.

## 2024-09-14 NOTE — Telephone Encounter (Signed)
 Surgical clearance faxed to [facility] with last office notes.

## 2025-01-31 ENCOUNTER — Ambulatory Visit: Admitting: Neurology

## 2025-09-04 ENCOUNTER — Ambulatory Visit: Admitting: Neurology
